# Patient Record
Sex: Female | Born: 1965 | Race: Black or African American | Hispanic: No | Marital: Single | Smoking: Never smoker
Health system: Southern US, Community
[De-identification: ages and names within clinical notes are randomized; demographics above are authoritative.]

## PROBLEM LIST (undated history)

## (undated) DIAGNOSIS — E785 Hyperlipidemia, unspecified: Secondary | ICD-10-CM

## (undated) DIAGNOSIS — R072 Precordial pain: Secondary | ICD-10-CM

## (undated) DIAGNOSIS — E119 Type 2 diabetes mellitus without complications: Secondary | ICD-10-CM

## (undated) DIAGNOSIS — D259 Leiomyoma of uterus, unspecified: Secondary | ICD-10-CM

## (undated) DIAGNOSIS — I10 Essential (primary) hypertension: Secondary | ICD-10-CM

## (undated) DIAGNOSIS — I2510 Atherosclerotic heart disease of native coronary artery without angina pectoris: Secondary | ICD-10-CM

## (undated) DIAGNOSIS — N924 Excessive bleeding in the premenopausal period: Secondary | ICD-10-CM

## (undated) DIAGNOSIS — M5412 Radiculopathy, cervical region: Secondary | ICD-10-CM

## (undated) DIAGNOSIS — G43109 Migraine with aura, not intractable, without status migrainosus: Secondary | ICD-10-CM

## (undated) HISTORY — DX: Atherosclerotic heart disease of native coronary artery without angina pectoris: I25.10

## (undated) HISTORY — DX: Type 2 diabetes mellitus without complications: E11.9

## (undated) HISTORY — DX: Hyperlipidemia, unspecified: E78.5

## (undated) HISTORY — DX: Radiculopathy, cervical region: M54.12

## (undated) HISTORY — DX: Excessive bleeding in the premenopausal period: N92.4

## (undated) HISTORY — DX: Migraine with aura, not intractable, without status migrainosus: G43.109

## (undated) HISTORY — DX: Leiomyoma of uterus, unspecified: D25.9

---

## 2016-10-29 ENCOUNTER — Ambulatory Visit (HOSPITAL_COMMUNITY)
Admission: EM | Admit: 2016-10-29 | Discharge: 2016-10-29 | Disposition: A | Discharge status: Home / Self Care | Payer: BLUE CROSS/BLUE SHIELD | Attending: Radiology | Admitting: Radiology

## 2016-10-29 ENCOUNTER — Encounter (HOSPITAL_COMMUNITY): Payer: Self-pay | Admitting: Family Medicine

## 2016-10-29 DIAGNOSIS — H1089 Other conjunctivitis: Secondary | ICD-10-CM

## 2016-10-29 HISTORY — DX: Essential (primary) hypertension: I10

## 2016-10-29 MED ORDER — TOBRAMYCIN 0.3 % OP SOLN
OPHTHALMIC | Status: AC
  Filled 2016-10-29: qty 5

## 2016-10-29 MED ORDER — TETRACAINE HCL 0.5 % OP SOLN
OPHTHALMIC | Status: AC
  Filled 2016-10-29: qty 4

## 2016-10-29 MED ORDER — TOBRAMYCIN 0.3 % OP SOLN
2.0000 [drp] | Freq: Two times a day (BID) | OPHTHALMIC | Status: DC
  Administered 2016-10-29: 2 [drp] via OPHTHALMIC

## 2016-10-29 NOTE — ED Provider Notes (Signed)
CSN: [id number]     Arrival date & time 10/29/16  1832 History   First MD Initiated Contact with Patient 10/29/16 1929     Chief Complaint  Patient presents with  . Eye Problem   (Consider location/radiation/quality/duration/timing/severity/associated sxs/prior Treatment) 51 y.o. female presents with right eye pain and discharge upon waking this morning. Patient denies any trauma and states she went to bed with no issues and woke up  And her eye was matted shut. Condition is acute in nature. Condition is made better by nothi9ng. Condition is made worse by nothing. Patient denies any treatment prior to there arrival at this facility. Patient states that her vision is impaired.        Past Medical History:  Diagnosis Date  . Hypertension    History reviewed. No pertinent surgical history. History reviewed. No pertinent family history. Social History  Substance Use Topics  . Smoking status: Not on file  . Smokeless tobacco: Not on file  . Alcohol use Not on file   OB History    No data available     Review of Systems  Constitutional: Negative for chills and fever.  HENT: Negative for ear pain and sore throat.   Eyes: Positive for discharge, redness and visual disturbance. Negative for pain.  Respiratory: Negative for cough and shortness of breath.   Cardiovascular: Negative for chest pain and palpitations.  Gastrointestinal: Negative for abdominal pain and vomiting.  Genitourinary: Negative for dysuria and hematuria.  Musculoskeletal: Negative for arthralgias and back pain.  Skin: Negative for color change and rash.  Neurological: Negative for seizures and syncope.  All other systems reviewed and are negative.   Allergies  Patient has no known allergies.  Home Medications   Prior to Admission medications   Not on File   Meds Ordered and Administered this Visit  Medications - No data to display  BP (!) 163/82   Pulse 82   Temp 97.9 F (36.6 C) (Oral)   Resp 18    SpO2 100%  No data found.   Physical Exam  Constitutional: She is oriented to person, place, and time. She appears well-developed and well-nourished.  HENT:  Head: Normocephalic and atraumatic.  Eyes: Pupils are equal, round, and reactive to light. Right eye exhibits discharge ( clear).  conjunctiva red to right eye. Clear discharge noted.   Neck: Normal range of motion.  Pulmonary/Chest: Effort normal.  Neurological: She is alert and oriented to person, place, and time.  Psychiatric: She has a normal mood and affect.  Nursing note and vitals reviewed.   Urgent Care Course     Procedures (including critical care time)  Labs Review Labs Reviewed - No data to display  Imaging Review No results found.   Visual Acuity Review  Noted in nursing notes      No abdnormality noted under fluoroscopic visualization.   MDM  No diagnosis found.    Jacqualine Mau, NP 10/29/16 2027

## 2016-10-29 NOTE — ED Notes (Signed)
Visual   Acuity  20/  50  Left    20/50  In  Right

## 2016-10-29 NOTE — ED Triage Notes (Signed)
Pt here for right eye, redness, drainage since today.

## 2016-10-29 NOTE — Discharge Instructions (Addendum)
Follow up with opthalmology on Monday. Apply drops to affected eye every 4 hours.

## 2017-01-12 DIAGNOSIS — M5412 Radiculopathy, cervical region: Secondary | ICD-10-CM

## 2017-01-12 DIAGNOSIS — G43109 Migraine with aura, not intractable, without status migrainosus: Secondary | ICD-10-CM

## 2017-01-12 HISTORY — DX: Migraine with aura, not intractable, without status migrainosus: G43.109

## 2017-01-12 HISTORY — DX: Radiculopathy, cervical region: M54.12

## 2017-01-28 ENCOUNTER — Emergency Department (HOSPITAL_COMMUNITY)
Admission: EM | Admit: 2017-01-28 | Discharge: 2017-01-28 | Disposition: A | Discharge status: Home / Self Care | Payer: BLUE CROSS/BLUE SHIELD | Attending: Emergency Medicine | Admitting: Emergency Medicine

## 2017-01-28 ENCOUNTER — Encounter (HOSPITAL_COMMUNITY): Payer: Self-pay | Admitting: Emergency Medicine

## 2017-01-28 ENCOUNTER — Emergency Department (HOSPITAL_COMMUNITY): Payer: BLUE CROSS/BLUE SHIELD

## 2017-01-28 DIAGNOSIS — I10 Essential (primary) hypertension: Secondary | ICD-10-CM | POA: Diagnosis not present

## 2017-01-28 DIAGNOSIS — R102 Pelvic and perineal pain: Secondary | ICD-10-CM

## 2017-01-28 DIAGNOSIS — D259 Leiomyoma of uterus, unspecified: Secondary | ICD-10-CM

## 2017-01-28 DIAGNOSIS — N760 Acute vaginitis: Secondary | ICD-10-CM | POA: Diagnosis not present

## 2017-01-28 DIAGNOSIS — B9689 Other specified bacterial agents as the cause of diseases classified elsewhere: Secondary | ICD-10-CM

## 2017-01-28 LAB — COMPREHENSIVE METABOLIC PANEL
ALK PHOS: 53 U/L (ref 38–126)
ALT: 14 U/L (ref 14–54)
ANION GAP: 9 (ref 5–15)
AST: 24 U/L (ref 15–41)
Albumin: 3.5 g/dL (ref 3.5–5.0)
BILIRUBIN TOTAL: 0.6 mg/dL (ref 0.3–1.2)
BUN: 7 mg/dL (ref 6–20)
CALCIUM: 9 mg/dL (ref 8.9–10.3)
CO2: 22 mmol/L (ref 22–32)
Chloride: 103 mmol/L (ref 101–111)
Creatinine, Ser: 0.95 mg/dL (ref 0.44–1.00)
GFR calc Af Amer: >60 mL/min (ref >60)
GFR calc non Af Amer: >60 mL/min (ref >60)
GLUCOSE: 99 mg/dL (ref 65–99)
Potassium: 3.9 mmol/L (ref 3.5–5.1)
Sodium: 134 mmol/L — ABNORMAL LOW (ref 135–145)
TOTAL PROTEIN: 8 g/dL (ref 6.5–8.1)

## 2017-01-28 LAB — URINALYSIS, ROUTINE W REFLEX MICROSCOPIC
Bilirubin Urine: NEGATIVE
GLUCOSE, UA: NEGATIVE mg/dL
Ketones, ur: NEGATIVE mg/dL
NITRITE: NEGATIVE
PH: 6 (ref 5.0–8.0)
Protein, ur: 30 mg/dL — AB
SPECIFIC GRAVITY, URINE: 1.008 (ref 1.005–1.030)

## 2017-01-28 LAB — CBC
HCT: 33.5 % — ABNORMAL LOW (ref 36.0–46.0)
Hemoglobin: 11.1 g/dL — ABNORMAL LOW (ref 12.0–15.0)
MCH: 25 pg — ABNORMAL LOW (ref 26.0–34.0)
MCHC: 33.1 g/dL (ref 30.0–36.0)
MCV: 75.5 fL — ABNORMAL LOW (ref 78.0–100.0)
PLATELETS: 341 10*3/uL (ref 150–400)
RBC: 4.44 MIL/uL (ref 3.87–5.11)
RDW: 14.4 % (ref 11.5–15.5)
WBC: 9.1 10*3/uL (ref 4.0–10.5)

## 2017-01-28 LAB — LIPASE, BLOOD: Lipase: 42 U/L (ref 11–51)

## 2017-01-28 LAB — WET PREP, GENITAL
SPERM: NONE SEEN
Trich, Wet Prep: NONE SEEN
Yeast Wet Prep HPF POC: NONE SEEN

## 2017-01-28 LAB — I-STAT BETA HCG BLOOD, ED (MC, WL, AP ONLY): I-stat hCG, quantitative: <5 m[IU]/mL (ref <5)

## 2017-01-28 MED ORDER — FENTANYL CITRATE (PF) 100 MCG/2ML IJ SOLN
INTRAMUSCULAR | Status: AC
  Filled 2017-01-28: qty 2

## 2017-01-28 MED ORDER — ONDANSETRON HCL 4 MG/2ML IJ SOLN
4.0000 mg | Freq: Once | INTRAMUSCULAR | Status: AC | PRN
  Administered 2017-01-28: 4 mg via INTRAVENOUS

## 2017-01-28 MED ORDER — FENTANYL CITRATE (PF) 100 MCG/2ML IJ SOLN
50.0000 ug | INTRAMUSCULAR | Status: DC | PRN
  Administered 2017-01-28: 50 ug via INTRAVENOUS

## 2017-01-28 MED ORDER — KETOROLAC TROMETHAMINE 30 MG/ML IJ SOLN
30.0000 mg | Freq: Once | INTRAMUSCULAR | Status: AC
  Administered 2017-01-28: 30 mg via INTRAVENOUS
  Filled 2017-01-28: qty 1

## 2017-01-28 MED ORDER — ONDANSETRON HCL 4 MG/2ML IJ SOLN
INTRAMUSCULAR | Status: AC
  Filled 2017-01-28: qty 2

## 2017-01-28 MED ORDER — METRONIDAZOLE 500 MG PO TABS: 500.0000 mg | ORAL_TABLET | Freq: Two times a day (BID) | ORAL | 0 refills | Status: DC

## 2017-01-28 NOTE — ED Provider Notes (Signed)
[facility] EMERGENCY DEPARTMENT Provider Note   CSN: [id number] Arrival date & time: 01/28/17  0219     History   Chief Complaint Chief Complaint  Patient presents with  . Pelvic Pain    HPI Jacqueline Hodges is a 51 y.o. female who presents with pelvic pain that began yesterday. Patient reports that pain is a constant "throbbing" and states that the pain is 8/10. She states that she has been taking ibuprofen with minimal improvement in symptoms. She denies any other alleviating factors. Patient denies any aggravating factors. Patient states that she initially thought it was a start of her menstrual cycle and she was experiencing cramps. She states that she has some blood when urinating but does not have any vaginal bleeding otherwise. Patient reports that her LMP was approximately with one month ago but does not recall the approximate date. Patient reports associated nausea and decreased appetite but no vomiting. Patient reports her last bowel movement was yesterday and denies any presence of blood. Patient states that she currently is active with one partner. They do not use any protection. Patient denies any history of STDs. Patient states that she recently moved from [facility] and has not been able to have a primary care doctor since moving up to [place]. She was previously on lisinopril but states that she has not taken it for several weeks because she has been out and has not had anybody to refill her medications. Patient denies any fevers, chills, chest pain, SOB, headaches, vision changes, dysuria, vaginal bleeding, vaginal discharge, numbness/weakness of her arms or legs.  The history is provided by the patient.    Past Medical History:  Diagnosis Date  . Hypertension     There are no active problems to display for this patient.   History reviewed. No pertinent surgical history.  OB History    No data available       Home Medications     Prior to Admission medications   Medication Sig Start Date End Date Taking? Authorizing Provider  tiZANidine (ZANAFLEX) 4 MG tablet Take 12 mg by mouth at bedtime. 01/12/17 01/12/18 Yes [provider]  metroNIDAZOLE (FLAGYL) 500 MG tablet Take 1 tablet (500 mg total) by mouth 2 (two) times daily. 01/28/17   Volanda Napoleon, PA-C    Family History No family history on file.  Social History Social History  Substance Use Topics  . Smoking status: Never Smoker  . Smokeless tobacco: Never Used  . Alcohol use No     Allergies   Patient has no known allergies.   Review of Systems Review of Systems  Constitutional: Negative for fever.  Eyes: Negative for visual disturbance.  Respiratory: Positive for cough. Negative for shortness of breath.   Cardiovascular: Negative for chest pain.  Gastrointestinal: Positive for abdominal pain and nausea. Negative for blood in stool, diarrhea and vomiting.  Genitourinary: Positive for hematuria and pelvic pain. Negative for dysuria.  Musculoskeletal: Negative for back pain and neck pain.  Neurological: Negative for weakness, numbness and headaches.     Physical Exam Updated Vital Signs BP (!) 179/95   Pulse 63   Temp 98.1 F (36.7 C)   Resp 18   Ht 5\' 5"  (1.651 m)   Wt 89.4 kg (197 lb)   LMP 01/28/2017 (Exact Date)   SpO2 97%   BMI 32.78 kg/m   Physical Exam  Constitutional: She is oriented to person, place, and time. She appears well-developed and well-nourished.  Sitting comfortably on examination table  HENT:  Head: Normocephalic and atraumatic.  Mouth/Throat: Oropharynx is clear and moist and mucous membranes are normal.  Eyes: Pupils are equal, round, and reactive to light. Conjunctivae, EOM and lids are normal.  Neck: Full passive range of motion without pain.  Cardiovascular: Normal rate, regular rhythm, normal heart sounds and normal pulses.  Exam reveals no gallop and no friction rub.   No murmur  heard. Pulmonary/Chest: Effort normal and breath sounds normal.  Abdominal: Soft. Normal appearance. There is tenderness in the suprapubic area. There is no rigidity, no guarding, no CVA tenderness and no tenderness at McBurney's point.  Genitourinary: Uterus normal. Cervix exhibits no motion tenderness. Right adnexum displays no mass and no tenderness. Left adnexum displays no mass and no tenderness. There is bleeding in the vagina.  Genitourinary Comments: The exam was performed with a chaperone present. Normal external female genitalia. No lesions, rash, or sores. Difficulty visualizing the cervix secondary to vaginal bleeding. No CMT. No adnexal mass or tenderness   Musculoskeletal: Normal range of motion.  Neurological: She is alert and oriented to person, place, and time. GCS eye subscore is 4. GCS verbal subscore is 5. GCS motor subscore is 6.  Cranial nerves III-XII intact Follows commands, Moves all extremities  5/5 strength to BUE and BLE  Sensation intact throughout all major nerve distributions Normal finger to nose. No dysdiadochokinesia. No pronator drift. No gait abnormalities  No slurred speech. No facial droop.   Skin: Skin is warm and dry. Capillary refill takes less than 2 seconds.  Psychiatric: She has a normal mood and affect. Her speech is normal.  Nursing note and vitals reviewed.    ED Treatments / Results  Labs (all labs ordered are listed, but only abnormal results are displayed) Labs Reviewed  WET PREP, GENITAL - Abnormal; Notable for the following:       Result Value   Clue Cells Wet Prep HPF POC PRESENT (*)    WBC, Wet Prep HPF POC MODERATE (*)    All other components within normal limits  COMPREHENSIVE METABOLIC PANEL - Abnormal; Notable for the following:    Sodium 134 (*)    All other components within normal limits  CBC - Abnormal; Notable for the following:    Hemoglobin 11.1 (*)    HCT 33.5 (*)    MCV 75.5 (*)    MCH 25.0 (*)    All other  components within normal limits  URINALYSIS, ROUTINE W REFLEX MICROSCOPIC - Abnormal; Notable for the following:    Color, Urine STRAW (*)    Hgb urine dipstick LARGE (*)    Protein, ur 30 (*)    Leukocytes, UA SMALL (*)    Bacteria, UA MANY (*)    Squamous Epithelial / LPF 0-5 (*)    All other components within normal limits  LIPASE, BLOOD  I-STAT BETA HCG BLOOD, ED ([facility], [facility], [facility] ONLY)  GC/CHLAMYDIA PROBE AMP ([place]) NOT AT [facility]    EKG  EKG Interpretation None       Radiology US Transvaginal Non-ob  Result Date: 01/28/2017 CLINICAL DATA:  51 year old premenopausal female with acute right pelvic pain for 1 day. EXAM: TRANSABDOMINAL AND TRANSVAGINAL ULTRASOUND OF PELVIS DOPPLER ULTRASOUND OF OVARIES TECHNIQUE: Both transabdominal and transvaginal ultrasound examinations of the pelvis were performed. Transabdominal technique was performed for global imaging of the pelvis including uterus, ovaries, adnexal regions, and pelvic cul-de-sac. It was necessary to proceed with endovaginal exam following the transabdominal  exam to visualize the ovaries and endometrium. Color and duplex Doppler ultrasound was utilized to evaluate blood flow to the ovaries. COMPARISON:  None. FINDINGS: Uterus Measurements: 10.4 x 6.2 x 7.6 cm and anteverted. Two intramural fibroids are identified as follows: A 2.4 x 1.8 x 2.3 cm posterior fundal fibroid. A 2.7 x 2.9 x 3.3 cm anterior right fundal fibroid. Endometrium Thickness: 11 mm.  No focal abnormality visualized. Right ovary Measurements: 2.6 x 1.7 x 1.8 cm. Normal appearance/no adnexal mass. Left ovary Measurements: 3 x 1.7 x 2.6 cm. A 1.5 x 1.4 x 1.3 cm probable cyst/corpus luteum noted. Pulsed Doppler evaluation of both ovaries demonstrates normal low-resistance arterial and venous waveforms. Other findings A trace amount of free pelvic fluid is noted. IMPRESSION: 1. No evidence of ovarian torsion. 2. Two intramural fundal fibroids as described. 3. Trace  amount of free pelvic fluid which may be physiologic. Electronically Signed   By: Margarette Canada M.D.   On: 01/28/2017 08:34   US Pelvis Complete  Result Date: 01/28/2017 CLINICAL DATA:  51 year old premenopausal female with acute right pelvic pain for 1 day. EXAM: TRANSABDOMINAL AND TRANSVAGINAL ULTRASOUND OF PELVIS DOPPLER ULTRASOUND OF OVARIES TECHNIQUE: Both transabdominal and transvaginal ultrasound examinations of the pelvis were performed. Transabdominal technique was performed for global imaging of the pelvis including uterus, ovaries, adnexal regions, and pelvic cul-de-sac. It was necessary to proceed with endovaginal exam following the transabdominal exam to visualize the ovaries and endometrium. Color and duplex Doppler ultrasound was utilized to evaluate blood flow to the ovaries. COMPARISON:  None. FINDINGS: Uterus Measurements: 10.4 x 6.2 x 7.6 cm and anteverted. Two intramural fibroids are identified as follows: A 2.4 x 1.8 x 2.3 cm posterior fundal fibroid. A 2.7 x 2.9 x 3.3 cm anterior right fundal fibroid. Endometrium Thickness: 11 mm.  No focal abnormality visualized. Right ovary Measurements: 2.6 x 1.7 x 1.8 cm. Normal appearance/no adnexal mass. Left ovary Measurements: 3 x 1.7 x 2.6 cm. A 1.5 x 1.4 x 1.3 cm probable cyst/corpus luteum noted. Pulsed Doppler evaluation of both ovaries demonstrates normal low-resistance arterial and venous waveforms. Other findings A trace amount of free pelvic fluid is noted. IMPRESSION: 1. No evidence of ovarian torsion. 2. Two intramural fundal fibroids as described. 3. Trace amount of free pelvic fluid which may be physiologic. Electronically Signed   By: Margarette Canada M.D.   On: 01/28/2017 08:34   [place] Art/ven Flow Abd Pelv Doppler  Result Date: 01/28/2017 CLINICAL DATA:  51 year old premenopausal female with acute right pelvic pain for 1 day. EXAM: TRANSABDOMINAL AND TRANSVAGINAL ULTRASOUND OF PELVIS DOPPLER ULTRASOUND OF OVARIES TECHNIQUE: Both  transabdominal and transvaginal ultrasound examinations of the pelvis were performed. Transabdominal technique was performed for global imaging of the pelvis including uterus, ovaries, adnexal regions, and pelvic cul-de-sac. It was necessary to proceed with endovaginal exam following the transabdominal exam to visualize the ovaries and endometrium. Color and duplex Doppler ultrasound was utilized to evaluate blood flow to the ovaries. COMPARISON:  None. FINDINGS: Uterus Measurements: 10.4 x 6.2 x 7.6 cm and anteverted. Two intramural fibroids are identified as follows: A 2.4 x 1.8 x 2.3 cm posterior fundal fibroid. A 2.7 x 2.9 x 3.3 cm anterior right fundal fibroid. Endometrium Thickness: 11 mm.  No focal abnormality visualized. Right ovary Measurements: 2.6 x 1.7 x 1.8 cm. Normal appearance/no adnexal mass. Left ovary Measurements: 3 x 1.7 x 2.6 cm. A 1.5 x 1.4 x 1.3 cm probable cyst/corpus luteum noted. Pulsed Doppler evaluation of  both ovaries demonstrates normal low-resistance arterial and venous waveforms. Other findings A trace amount of free pelvic fluid is noted. IMPRESSION: 1. No evidence of ovarian torsion. 2. Two intramural fundal fibroids as described. 3. Trace amount of free pelvic fluid which may be physiologic. Electronically Signed   By: Margarette Canada M.D.   On: 01/28/2017 08:34    Procedures Procedures (including critical care time)  Medications Ordered in ED Medications  ondansetron (ZOFRAN) injection 4 mg (4 mg Intravenous Given 01/28/17 0245)  ketorolac (TORADOL) 30 MG/ML injection 30 mg (30 mg Intravenous Given 01/28/17 0844)     Initial Impression / Assessment and Plan / ED Course  I have reviewed the triage vital signs and the nursing notes.  Pertinent labs & imaging results that were available during my care of the patient were reviewed by me and considered in my medical decision making (see chart for details).     51 year old female who presents with pelvic pain that began  yesterday. Patient also reports that she started having some blood in her urine this morning. Has not had any vaginal bleeding. Patient is afebrile, non-toxic appearing, sitting comfortably on examination table. Vital signs reviewed. Initial vital signs and the patient is hypertensive. She reports a history of hypertension and states that she was previously on lisinopril. Patient states that she has been out of lisinopril for several weeks and has not taken it. She has not had chance follow-up with primary care doctor lisinopril refilled. She does not recall what she is on. Consider UTI vs acute infectious etiology vs pregnancy vs msk pain. Initial labs ordered triage. Initial pain medications given at triage. Analgesics provided in the department.  While patient was in ED, patient states that she started having some vaginal bleeding and started her period. Suspect this may be contributing to her symptoms. Patient reports that she had 2 periods this last month. She reports that her last period was approximately 2-3 weeks ago.  Labs reviewed. Beta negative. CMP is unremarkable. Lipase unremarkable. CBC shows anemia of 11.1 and 33.5. No priors for comparison. UA shows large hemoglobin, small leuks, pyuria. Will plan for pelvic exam in the department.  Pelvic exam as documented above. There is vaginal bleeding noted in the canal which made the cervix is difficult to visualize. No CMT on bimanual exam. Pelvic exam is not concerning for PID. Wet prep and GC/chlamydia sent. Given extensive bleeding, will evaluate with ultrasound.  Ultrasound shows evidence of 2 intramural uterine fibroids. Suspect this may be the cause of patient's pain and dysfunctional uterine bleeding. There is no evidence of ovarian torsion. Wet prep reviewed. Positive for clue cells. Discussed results with patient. Reevaluation shows improved abdominal exam. She is still slightly tender to the lower pelvic region but otherwise unremarkable.  Plan to provide patient with outpatient opioid 20 and referral to follow-up regarding uterine fibroids. Instructed on conservative home therapy. Patient provided clinic resources for primary care follow-up. Instructed patient to follow-up regarding her high blood pressure and getting her prescription medications refilled. Strict return precautions discussed. Patient expresses understanding and agreement to plan.     Final Clinical Impressions(s) / ED Diagnoses   Final diagnoses:  Uterine leiomyoma, unspecified location  BV (bacterial vaginosis)  Pelvic pain    New Prescriptions Discharge Medication List as of 01/28/2017  9:14 AM    START taking these medications   Details  metroNIDAZOLE (FLAGYL) 500 MG tablet Take 1 tablet (500 mg total) by mouth 2 (two) times daily., Starting  Sat 01/28/2017, Print         Volanda Napoleon, PA-C 01/29/17 1524    Ripley Fraise, MD 01/31/17 1433

## 2017-01-28 NOTE — ED Notes (Signed)
Pt states she understandsinstructions. Home stable with fiance with steady gait.

## 2017-01-28 NOTE — ED Triage Notes (Signed)
Reports  Having pelvic pain that started yesterday that got worse throughout the day.  Reports it started with nausea.  Now c/o pelvic pain and rectal pain.  Notably uncomfortable in triage.

## 2017-01-28 NOTE — ED Notes (Signed)
Took patient saline lot out

## 2017-01-28 NOTE — Discharge Instructions (Signed)
You can take Tylenol or Ibuprofen as directed for pain. You can alternate Tylenol and Ibuprofen every 4 hours. If you take Tylenol at 1pm, then you can take Ibuprofen at 5pm. Then you can take Tylenol again at 9pm. Do not take any more ibuprofen until later tonight because the medication we gave you has ibuprofen in it.   Take Flagyl as directed.  It is very important that you do not consume any alcohol while taking this medication as it will cause you to become violently ill.  Follow-up with the referred [facility] for further evaluation.   Follow-up with the referred [facility] for evaluation of your blood pressure and for restarting your medications.   Return to the Emergency Department for any worsening pain, vomiting, chest pain, SOB, fever, or any other worsening or concerning symptoms.

## 2017-01-30 LAB — GC/CHLAMYDIA PROBE AMP (~~LOC~~) NOT AT ARMC
CHLAMYDIA, DNA PROBE: NEGATIVE
Neisseria Gonorrhea: NEGATIVE

## 2017-02-20 ENCOUNTER — Encounter: Payer: Self-pay | Admitting: Obstetrics & Gynecology

## 2017-02-20 ENCOUNTER — Ambulatory Visit (INDEPENDENT_AMBULATORY_CARE_PROVIDER_SITE_OTHER): Payer: BLUE CROSS/BLUE SHIELD | Admitting: Obstetrics & Gynecology

## 2017-02-20 ENCOUNTER — Ambulatory Visit (INDEPENDENT_AMBULATORY_CARE_PROVIDER_SITE_OTHER): Payer: Self-pay | Admitting: Clinical

## 2017-02-20 VITALS — BP 158/98 | HR 72 | Ht 65.0 in | Wt 198.0 lb

## 2017-02-20 DIAGNOSIS — N924 Excessive bleeding in the premenopausal period: Secondary | ICD-10-CM | POA: Diagnosis not present

## 2017-02-20 DIAGNOSIS — D259 Leiomyoma of uterus, unspecified: Secondary | ICD-10-CM | POA: Insufficient documentation

## 2017-02-20 DIAGNOSIS — F4321 Adjustment disorder with depressed mood: Secondary | ICD-10-CM

## 2017-02-20 DIAGNOSIS — R4589 Other symptoms and signs involving emotional state: Secondary | ICD-10-CM | POA: Diagnosis not present

## 2017-02-20 HISTORY — DX: Excessive bleeding in the premenopausal period: N92.4

## 2017-02-20 HISTORY — DX: Leiomyoma of uterus, unspecified: D25.9

## 2017-02-20 NOTE — Progress Notes (Signed)
Patient ID: Jacqueline Hodges, female   DOB: 1965-05-12, 51 y.o.   MRN: [id number]  Chief Complaint  Patient presents with  . Follow-up    from ER for pelvic pain  . Menstrual Problem    irregular bleeding, frequent periods    HPI Jacqueline Hodges is a 51 y.o. female.  She is referred from the ED after presenting with lower abdominal pain 10/23 thought to be associated with her menses. An US showed intramural fibroids. No bleeding or pain now. Patient's last menstrual period was 01/28/2017 (exact date).  HPI  Past Medical History:  Diagnosis Date  . Hypertension     Past Surgical History:  Procedure Laterality Date  . CESAREAN SECTION      History reviewed. No pertinent family history.  Social History Social History   Tobacco Use  . Smoking status: Never Smoker  . Smokeless tobacco: Never Used  Substance Use Topics  . Alcohol use: No  . Drug use: No    No Known Allergies  No current outpatient medications on file.   No current facility-administered medications for this visit.     Review of Systems Review of Systems  Constitutional: Negative.   Respiratory: Negative.   Gastrointestinal: Negative.   Genitourinary: Positive for menstrual problem (heavy menses) and pelvic pain.    Blood pressure (!) 158/98, pulse 72, height 5\' 5"  (1.651 m), weight 89.8 kg (198 lb), last menstrual period 01/28/2017.  Physical Exam Physical Exam  Constitutional: She appears well-developed. No distress.  Cardiovascular: Normal rate.  Pulmonary/Chest: Effort normal. No respiratory distress.  Genitourinary: Vagina normal. No vaginal discharge found.  Genitourinary Comments: Uterus 6 week size no adnexal masses    Data Reviewed CBC    Component Value Date/Time   WBC 9.1 01/28/2017 0244   RBC 4.44 01/28/2017 0244   HGB 11.1 (L) 01/28/2017 0244   HCT 33.5 (L) 01/28/2017 0244   PLT 341 01/28/2017 0244   MCV 75.5 (L) 01/28/2017 0244   MCH 25.0 (L) 01/28/2017 0244   MCHC 33.1  01/28/2017 0244   RDW 14.4 01/28/2017 0244   CLINICAL DATA:  51 year old premenopausal female with acute right pelvic pain for 1 day.  EXAM: TRANSABDOMINAL AND TRANSVAGINAL ULTRASOUND OF PELVIS  DOPPLER ULTRASOUND OF OVARIES  TECHNIQUE: Both transabdominal and transvaginal ultrasound examinations of the pelvis were performed. Transabdominal technique was performed for global imaging of the pelvis including uterus, ovaries, adnexal regions, and pelvic cul-de-sac.  It was necessary to proceed with endovaginal exam following the transabdominal exam to visualize the ovaries and endometrium. Color and duplex Doppler ultrasound was utilized to evaluate blood flow to the ovaries.  COMPARISON:  None.  FINDINGS: Uterus  Measurements: 10.4 x 6.2 x 7.6 cm and anteverted. Two intramural fibroids are identified as follows:  A 2.4 x 1.8 x 2.3 cm posterior fundal fibroid.  A 2.7 x 2.9 x 3.3 cm anterior right fundal fibroid.  Endometrium  Thickness: 11 mm.  No focal abnormality visualized.  Right ovary  Measurements: 2.6 x 1.7 x 1.8 cm. Normal appearance/no adnexal mass.  Left ovary  Measurements: 3 x 1.7 x 2.6 cm. A 1.5 x 1.4 x 1.3 cm probable cyst/corpus luteum noted.  Pulsed Doppler evaluation of both ovaries demonstrates normal low-resistance arterial and venous waveforms.  Other findings  A trace amount of free pelvic fluid is noted.  IMPRESSION: 1. No evidence of ovarian torsion. 2. Two intramural fundal fibroids as described. 3. Trace amount of free pelvic fluid which may be physiologic.  Electronically Signed   By: Margarette Canada M.D.   On: 01/28/2017 08:34  Assessment    Patient Active Problem List   Diagnosis Date Noted  . Fibroid uterus 02/20/2017  . Abnormal perimenopausal bleeding 02/20/2017  . Cervical radiculopathy 01/12/2017  . Migraine with aura and without status migrainosus, not intractable 01/12/2017        Plan     CBC, FSH, TSH sent Menstrual calendar RTC 3 months To see [place]          Emeterio Reeve 02/20/2017, 11:04 AM

## 2017-02-20 NOTE — BH Specialist Note (Signed)
Integrated Behavioral Health Initial Visit  MRN: [id number] Name: Jacqueline Hodges  Number of [facility] Clinician visits:: 1/6 Session Start time: 11:15  Session End time: 11:55 Total time: 40 minutes  Type of Service: [place] Interpretor:No. Interpretor Name and Language: n/a   Warm Hand Off Completed.       SUBJECTIVE: Jacqueline Hodges is a 51 y.o. female accompanied by n/a Patient was referred by Dr Roselie Awkward for symptoms of depression. Patient reports the following symptoms/concerns: Pt states her primary concern today is grieving three family losses in the past year, including her mother one year ago, and most recent, an aunt in past week. Pt is also adjusting to living away from her adult children.  Duration of problem: Most recent, past week; Severity of problem: moderate  OBJECTIVE: Mood: Anxious and Depressed and Affect: Depressed Risk of harm to self or others: No plan to harm self or others  LIFE CONTEXT: Family and Social: Supportive friend locally; adult children live near [place]. No siblings School/Work: Works two jobs; Production assistant, radio to one fulltime Self-Care: Recognizing need for greater self-care Life Changes: Loss of mother and two other family members in past year, move from [place] to [place] after loss of mother, including job change  GOALS ADDRESSED: Patient will: 1. Reduce symptoms of: anxiety and depression 2. Increase knowledge and/or ability of: self-management skills  3. Demonstrate ability to: Increase healthy adjustment to current life circumstances and Begin healthy grieving over loss  INTERVENTIONS: Interventions utilized: Mindfulness or Psychologist, educational, Supportive Counseling, Psychoeducation and/or Health Education and Link to [employer]  Standardized Assessments completed: GAD-7 and PHQ 9  ASSESSMENT: Patient currently experiencing Grief.   Patient may benefit  from psychoeducation and brief therapeutic intervention regarding coping with symptoms of anxiety and depression related to grief.  PLAN: 1. Follow up with behavioral health clinician on : As needed 2. Behavioral recommendations:  -Consider hospice group grief counseling, when ready -CALM relaxation breathing exercises as needed (prior to sleep; in morning) -Consider using sleep sound and other apps for additional self-coping -Read educational material regarding coping with symptoms of anxiety and depression 3. Referral(s): [facility] (In Clinic) 4. "From scale of 1-10, how likely are you to follow plan?": 8  Garlan Fair, LCSWA  Depression screen [facility] 2/9 02/20/2017  Decreased Interest 3  Down, Depressed, Hopeless 3  PHQ - 2 Score 6  Altered sleeping 3  Tired, decreased energy 3  Change in appetite 0  Feeling bad or failure about yourself  0  Trouble concentrating 0  Moving slowly or fidgety/restless 0  Suicidal thoughts 0  PHQ-9 Score 12   GAD 7 : Generalized Anxiety Score 02/20/2017  Nervous, Anxious, on Edge 1  Control/stop worrying 3  Worry too much - different things 3  Trouble relaxing 1  Restless 0  Easily annoyed or irritable 1  Afraid - awful might happen 0  Total GAD 7 Score 9

## 2017-02-20 NOTE — Patient Instructions (Signed)
Therapist, music at [facility], [place], [place] [address] [phone]  [facility] [place], [place], [place] [address] [phone]

## 2017-02-20 NOTE — Progress Notes (Signed)
Patient verbally consented to meet with [facility] Clinician about presenting concerns. Dr Roselie Awkward aware

## 2017-02-21 LAB — CBC
HEMOGLOBIN: 11.2 g/dL (ref 11.1–15.9)
Hematocrit: 36 % (ref 34.0–46.6)
MCH: 24.7 pg — AB (ref 26.6–33.0)
MCHC: 31.1 g/dL — AB (ref 31.5–35.7)
MCV: 80 fL (ref 79–97)
PLATELETS: 429 10*3/uL — AB (ref 150–379)
RBC: 4.53 x10E6/uL (ref 3.77–5.28)
RDW: 15.8 % — ABNORMAL HIGH (ref 12.3–15.4)
WBC: 5.9 10*3/uL (ref 3.4–10.8)

## 2017-02-21 LAB — FOLLICLE STIMULATING HORMONE: FSH: 30.1 m[IU]/mL

## 2017-02-21 LAB — TSH: TSH: 1.64 u[IU]/mL (ref 0.450–4.500)

## 2017-02-23 ENCOUNTER — Telehealth: Payer: Self-pay | Admitting: General Practice

## 2017-02-23 NOTE — Telephone Encounter (Signed)
Called patient, no answer- unable to leave message as voicemail box was full. Will send letter

## 2017-02-23 NOTE — Telephone Encounter (Signed)
-----   Message from Woodroe Mode, MD sent at 02/22/2017  8:25 AM EST ----- Perimenopausal, o/w normal

## 2017-07-14 ENCOUNTER — Other Ambulatory Visit: Payer: Self-pay

## 2017-07-14 ENCOUNTER — Emergency Department (HOSPITAL_COMMUNITY)
Admission: EM | Admit: 2017-07-14 | Discharge: 2017-07-14 | Disposition: A | Discharge status: Home / Self Care | Payer: BLUE CROSS/BLUE SHIELD | Attending: Emergency Medicine | Admitting: Emergency Medicine

## 2017-07-14 DIAGNOSIS — R102 Pelvic and perineal pain: Secondary | ICD-10-CM | POA: Diagnosis not present

## 2017-07-14 DIAGNOSIS — N939 Abnormal uterine and vaginal bleeding, unspecified: Secondary | ICD-10-CM | POA: Insufficient documentation

## 2017-07-14 LAB — COMPREHENSIVE METABOLIC PANEL
ALBUMIN: 3.3 g/dL — AB (ref 3.5–5.0)
ALT: 17 U/L (ref 14–54)
AST: 19 U/L (ref 15–41)
Alkaline Phosphatase: 52 U/L (ref 38–126)
Anion gap: 8 (ref 5–15)
BUN: 13 mg/dL (ref 6–20)
CHLORIDE: 107 mmol/L (ref 101–111)
CO2: 22 mmol/L (ref 22–32)
CREATININE: 0.9 mg/dL (ref 0.44–1.00)
Calcium: 9.2 mg/dL (ref 8.9–10.3)
GFR calc Af Amer: >60 mL/min (ref >60)
Glucose, Bld: 132 mg/dL — ABNORMAL HIGH (ref 65–99)
POTASSIUM: 3.6 mmol/L (ref 3.5–5.1)
Sodium: 137 mmol/L (ref 135–145)
Total Bilirubin: 0.4 mg/dL (ref 0.3–1.2)
Total Protein: 7.7 g/dL (ref 6.5–8.1)

## 2017-07-14 LAB — I-STAT BETA HCG BLOOD, ED (MC, WL, AP ONLY): I-stat hCG, quantitative: <5 m[IU]/mL (ref <5)

## 2017-07-14 LAB — WET PREP, GENITAL
Clue Cells Wet Prep HPF POC: NONE SEEN
SPERM: NONE SEEN
Trich, Wet Prep: NONE SEEN
WBC WET PREP: NONE SEEN
Yeast Wet Prep HPF POC: NONE SEEN

## 2017-07-14 LAB — CBC
HEMATOCRIT: 30.6 % — AB (ref 36.0–46.0)
Hemoglobin: 10.1 g/dL — ABNORMAL LOW (ref 12.0–15.0)
MCH: 24.6 pg — AB (ref 26.0–34.0)
MCHC: 33 g/dL (ref 30.0–36.0)
MCV: 74.6 fL — AB (ref 78.0–100.0)
PLATELETS: 344 10*3/uL (ref 150–400)
RBC: 4.1 MIL/uL (ref 3.87–5.11)
RDW: 15.7 % — AB (ref 11.5–15.5)
WBC: 7.3 10*3/uL (ref 4.0–10.5)

## 2017-07-14 LAB — RPR: RPR Ser Ql: NONREACTIVE

## 2017-07-14 LAB — LIPASE, BLOOD: LIPASE: 38 U/L (ref 11–51)

## 2017-07-14 LAB — HIV ANTIBODY (ROUTINE TESTING W REFLEX): HIV Screen 4th Generation wRfx: NONREACTIVE

## 2017-07-14 MED ORDER — MEDROXYPROGESTERONE ACETATE 10 MG PO TABS
10.0000 mg | ORAL_TABLET | Freq: Every day | ORAL | Status: DC
  Administered 2017-07-14: 10 mg via ORAL
  Filled 2017-07-14: qty 1

## 2017-07-14 MED ORDER — MEDROXYPROGESTERONE ACETATE 10 MG PO TABS: 10.0000 mg | ORAL_TABLET | Freq: Every day | ORAL | 0 refills | Status: DC

## 2017-07-14 MED ORDER — NAPROXEN 500 MG PO TABS: 500.0000 mg | ORAL_TABLET | Freq: Two times a day (BID) | ORAL | 0 refills | Status: DC

## 2017-07-14 MED ORDER — TRAMADOL HCL 50 MG PO TABS: 50.0000 mg | ORAL_TABLET | Freq: Four times a day (QID) | ORAL | 0 refills | Status: DC | PRN

## 2017-07-14 NOTE — ED Provider Notes (Signed)
[facility] DEPT Provider Note   CSN: [id number] Arrival date & time: 07/14/17  0013     History   Chief Complaint Chief Complaint  Patient presents with  . Vaginal Bleeding    HPI Jacqueline Hodges is a 52 y.o. female.  The history is provided by the patient.  She has history of hypertension, uterine fibroids, abnormal perimenopausal bleeding and comes in with heavy vaginal bleeding and cramping.  She states she did not have a menses last month, but started spotting 4 days ago.  Bleeding is gotten progressively worse each day.  She started passing clots yesterday and was passing large clots today.  Today, she started developing severe suprapubic cramping.  Pain is been as severe as 10/10.  She denies nausea or vomiting.  She denies dizziness, but does feel generally weak.  Of note, she is status post tubal ligation.  Past Medical History:  Diagnosis Date  . Hypertension     Patient Active Problem List   Diagnosis Date Noted  . Fibroid uterus 02/20/2017  . Abnormal perimenopausal bleeding 02/20/2017  . Cervical radiculopathy 01/12/2017  . Migraine with aura and without status migrainosus, not intractable 01/12/2017    Past Surgical History:  Procedure Laterality Date  . CESAREAN SECTION       OB History    Gravida  3   Para  3   Term  3   Preterm  0   AB  0   Living  3     SAB  0   TAB  0   Ectopic  0   Multiple  0   Live Births  0            Home Medications    Prior to Admission medications   Not on File    Family History No family history on file.  Social History Social History   Tobacco Use  . Smoking status: Never Smoker  . Smokeless tobacco: Never Used  Substance Use Topics  . Alcohol use: No  . Drug use: No     Allergies   Patient has no known allergies.   Review of Systems Review of Systems  All other systems reviewed and are negative.    Physical Exam Updated Vital Signs BP (!)  163/96 (BP Location: Left Arm)   Pulse 74   Temp 98.3 F (36.8 C) (Oral)   Resp 16   LMP 07/11/2017   SpO2 100%   Physical Exam  Nursing note and vitals reviewed.  52  year old female, resting comfortably and in no acute distress. Vital signs are significant for elevated blood pressure. Oxygen saturation is 100%, which is normal. Head is normocephalic and atraumatic. PERRLA, EOMI. Oropharynx is clear. Neck is nontender and supple without adenopathy or JVD. Back is nontender and there is no CVA tenderness. Lungs are clear without rales, wheezes, or rhonchi. Chest is nontender. Heart has regular rate and rhythm without murmur. Abdomen is soft, flat, nontender without masses or hepatosplenomegaly and peristalsis is normoactive. Elva: Normal external female genitalia.  Clot present in the cervical loss.  When cultures were obtained, clot was dislodged and there is a brief gush of blood.  There are no adnexal masses or tenderness.  There is no cervical motion tenderness.  Fundus is 6 weeks size. Extremities have trace edema, full range of motion is present. Skin is warm and dry without rash. Neurologic: Mental status is normal, cranial nerves are intact, there are no motor  or sensory deficits.  ED Treatments / Results  Labs (all labs ordered are listed, but only abnormal results are displayed) Labs Reviewed  COMPREHENSIVE METABOLIC PANEL - Abnormal; Notable for the following components:      Result Value   Glucose, Bld 132 (*)    Albumin 3.3 (*)    All other components within normal limits  CBC - Abnormal; Notable for the following components:   Hemoglobin 10.1 (*)    HCT 30.6 (*)    MCV 74.6 (*)    MCH 24.6 (*)    RDW 15.7 (*)    All other components within normal limits  WET PREP, GENITAL  LIPASE, BLOOD  RPR  HIV ANTIBODY (ROUTINE TESTING)  I-STAT BETA HCG BLOOD, ED ([facility], [facility], [facility] ONLY)  GC/CHLAMYDIA PROBE AMP ([facility]) NOT AT [facility]   Procedures Procedures   Medications  Ordered in ED Medications  medroxyPROGESTERone (PROVERA) tablet 10 mg (has no administration in time range)     Initial Impression / Assessment and Plan / ED Course  I have reviewed the triage vital signs and the nursing notes.  Pertinent lab results that were available during my care of the patient were reviewed by me and considered in my medical decision making (see chart for details).  Abnormal uterine bleeding and perimenopausal timeframe.  Old records are reviewed confirming, ED visit 6 months ago for pelvic pain and bleeding at which time ultrasound that showed 2 uterine fibroids.  Hemoglobin has fallen slightly from that, but she is hemodynamically stable.  Bleeding does not seem to be at a dangerous level.  She is discharged home with prescription for medroxyprogesterone and tramadol and is to follow-up with her gynecologist.  Final Clinical Impressions(s) / ED Diagnoses   Final diagnoses:  Abnormal vaginal bleeding  Pelvic cramping    ED Discharge Orders        Ordered    medroxyPROGESTERone (PROVERA) 10 MG tablet  Daily     07/14/17 0542    traMADol (ULTRAM) 50 MG tablet  Every 6 hours PRN     07/14/17 0542    naproxen (NAPROSYN) 500 MG tablet  2 times daily     78/29/56 2130       Delora Fuel, MD 86/57/84 [phone]

## 2017-07-14 NOTE — ED Triage Notes (Signed)
Pt from home with c/o vaginal bleeding. Pt states she has been told she has fibroids. Pt states her cycle started on Tuesday this month. Pt states she has been told her heavy bleeding is causing her to be anemic. Pt states she is lightheadedness today. Pt states she has had multiple clots pass. Pt has pelvic pain that she rates 3/10

## 2017-07-14 NOTE — Discharge Instructions (Signed)
Return if you are having any problems. 

## 2017-07-14 NOTE — ED Notes (Signed)
Pelvic cart at bedside. 

## 2017-07-17 LAB — GC/CHLAMYDIA PROBE AMP (~~LOC~~) NOT AT ARMC
Chlamydia: NEGATIVE
NEISSERIA GONORRHEA: NEGATIVE

## 2017-11-30 ENCOUNTER — Other Ambulatory Visit: Payer: Self-pay

## 2017-11-30 ENCOUNTER — Emergency Department (HOSPITAL_COMMUNITY): Payer: BLUE CROSS/BLUE SHIELD

## 2017-11-30 ENCOUNTER — Encounter (HOSPITAL_COMMUNITY): Payer: Self-pay

## 2017-11-30 ENCOUNTER — Emergency Department (HOSPITAL_COMMUNITY)
Admission: EM | Admit: 2017-11-30 | Discharge: 2017-11-30 | Disposition: A | Discharge status: Home / Self Care | Payer: BLUE CROSS/BLUE SHIELD | Attending: Emergency Medicine | Admitting: Emergency Medicine

## 2017-11-30 DIAGNOSIS — I10 Essential (primary) hypertension: Secondary | ICD-10-CM | POA: Diagnosis not present

## 2017-11-30 DIAGNOSIS — Z7982 Long term (current) use of aspirin: Secondary | ICD-10-CM | POA: Diagnosis not present

## 2017-11-30 DIAGNOSIS — Z79899 Other long term (current) drug therapy: Secondary | ICD-10-CM | POA: Diagnosis not present

## 2017-11-30 DIAGNOSIS — R0789 Other chest pain: Secondary | ICD-10-CM | POA: Diagnosis not present

## 2017-11-30 DIAGNOSIS — R079 Chest pain, unspecified: Secondary | ICD-10-CM | POA: Diagnosis present

## 2017-11-30 LAB — BASIC METABOLIC PANEL
Anion gap: 8 (ref 5–15)
BUN: 14 mg/dL (ref 6–20)
CO2: 25 mmol/L (ref 22–32)
Calcium: 9.9 mg/dL (ref 8.9–10.3)
Chloride: 109 mmol/L (ref 98–111)
Creatinine, Ser: 0.98 mg/dL (ref 0.44–1.00)
Glucose, Bld: 113 mg/dL — ABNORMAL HIGH (ref 70–99)
POTASSIUM: 4.3 mmol/L (ref 3.5–5.1)
SODIUM: 142 mmol/L (ref 135–145)

## 2017-11-30 LAB — CBC
HCT: 33.4 % — ABNORMAL LOW (ref 36.0–46.0)
Hemoglobin: 10.8 g/dL — ABNORMAL LOW (ref 12.0–15.0)
MCH: 22.7 pg — ABNORMAL LOW (ref 26.0–34.0)
MCHC: 32.3 g/dL (ref 30.0–36.0)
MCV: 70.2 fL — ABNORMAL LOW (ref 78.0–100.0)
PLATELETS: 368 10*3/uL (ref 150–400)
RBC: 4.76 MIL/uL (ref 3.87–5.11)
RDW: 17.6 % — AB (ref 11.5–15.5)
WBC: 5.4 10*3/uL (ref 4.0–10.5)

## 2017-11-30 LAB — I-STAT TROPONIN, ED
TROPONIN I, POC: 0 ng/mL (ref 0.00–0.08)
Troponin i, poc: 0 ng/mL (ref 0.00–0.08)

## 2017-11-30 MED ORDER — LISINOPRIL 10 MG PO TABS
10.0000 mg | ORAL_TABLET | Freq: Once | ORAL | Status: AC
  Administered 2017-11-30: 10 mg via ORAL
  Filled 2017-11-30: qty 1

## 2017-11-30 MED ORDER — LISINOPRIL-HYDROCHLOROTHIAZIDE 20-12.5 MG PO TABS: 1.0000 | ORAL_TABLET | Freq: Every day | ORAL | 0 refills | Status: DC

## 2017-11-30 NOTE — ED Provider Notes (Signed)
[facility] DEPT Provider Note   CSN: [id number] Arrival date & time: 11/30/17  1539     History   Chief Complaint Chief Complaint  Patient presents with  . Chest Pain    HPI Jacqueline Hodges is a 52 y.o. female.  Patient complains of some chest discomfort.  No shortness of breath no sweating.  Patient has not been taking her blood pressure medicine.  The history is provided by the patient. No language interpreter was used.  Chest Pain   This is a new problem. The current episode started less than 1 hour ago. The problem occurs rarely. The problem has been resolved. The pain is associated with movement. The pain is present in the substernal region. The pain is at a severity of 3/10. The pain is moderate. The quality of the pain is described as brief. The pain does not radiate. Exacerbated by: Unknown. Pertinent negatives include no abdominal pain, no back pain, no cough and no headaches.  Pertinent negatives for past medical history include no seizures.    Past Medical History:  Diagnosis Date  . Hypertension     Patient Active Problem List   Diagnosis Date Noted  . Fibroid uterus 02/20/2017  . Abnormal perimenopausal bleeding 02/20/2017  . Cervical radiculopathy 01/12/2017  . Migraine with aura and without status migrainosus, not intractable 01/12/2017    Past Surgical History:  Procedure Laterality Date  . CESAREAN SECTION       OB History    Gravida  3   Para  3   Term  3   Preterm  0   AB  0   Living  3     SAB  0   TAB  0   Ectopic  0   Multiple  0   Live Births  0            Home Medications    Prior to Admission medications   Medication Sig Start Date End Date Taking? Authorizing Provider  aspirin EC 325 MG tablet Take 650 mg by mouth daily as needed (chest pain).   Yes [provider]  lisinopril-hydrochlorothiazide (ZESTORETIC) 20-12.5 MG tablet Take 1 tablet by mouth daily. 11/30/17   Milton Ferguson, MD  medroxyPROGESTERone (PROVERA) 10 MG tablet Take 1 tablet (10 mg total) by mouth daily. Patient not taking: Reported on 8/54/6270 06/14/98   Delora Fuel, MD  naproxen (NAPROSYN) 500 MG tablet Take 1 tablet (500 mg total) by mouth 2 (two) times daily. Patient not taking: Reported on 9/38/1829 12/12/69   Delora Fuel, MD  traMADol (ULTRAM) 50 MG tablet Take 1 tablet (50 mg total) by mouth every 6 (six) hours as needed. Patient not taking: Reported on 6/96/7893 11/10/99   Delora Fuel, MD    Family History Family History  Problem Relation Age of Onset  . Heart failure Mother   . Diabetes Mother   . High Cholesterol Mother     Social History Social History   Tobacco Use  . Smoking status: Never Smoker  . Smokeless tobacco: Never Used  Substance Use Topics  . Alcohol use: No  . Drug use: No     Allergies   Patient has no known allergies.   Review of Systems Review of Systems  Constitutional: Negative for appetite change and fatigue.  HENT: Negative for congestion, ear discharge and sinus pressure.   Eyes: Negative for discharge.  Respiratory: Negative for cough.   Cardiovascular: Positive for chest pain.  Gastrointestinal:  Negative for abdominal pain and diarrhea.  Genitourinary: Negative for frequency and hematuria.  Musculoskeletal: Negative for back pain.  Skin: Negative for rash.  Neurological: Negative for seizures and headaches.  Psychiatric/Behavioral: Negative for hallucinations.     Physical Exam Updated Vital Signs BP (!) 174/96 Comment: Simultaneous filing. User may not have seen previous data.  Pulse 63   Temp 98.2 F (36.8 C) (Oral)   Resp 12   Ht 5\' 5"  (1.651 m)   Wt 99.8 kg   LMP 06/30/2017   SpO2 100%   BMI 36.61 kg/m   Physical Exam  Constitutional: She is oriented to person, place, and time. She appears well-developed.  HENT:  Head: Normocephalic.  Eyes: Conjunctivae and EOM are normal. No scleral icterus.  Neck: Neck supple. No  thyromegaly present.  Cardiovascular: Normal rate and regular rhythm. Exam reveals no gallop and no friction rub.  No murmur heard. Pulmonary/Chest: No stridor. She has no wheezes. She has no rales. She exhibits no tenderness.  Abdominal: She exhibits no distension. There is no tenderness. There is no rebound.  Musculoskeletal: Normal range of motion. She exhibits no edema.  Lymphadenopathy:    She has no cervical adenopathy.  Neurological: She is oriented to person, place, and time. She exhibits normal muscle tone. Coordination normal.  Skin: No rash noted. No erythema.  Psychiatric: She has a normal mood and affect. Her behavior is normal.     ED Treatments / Results  Labs (all labs ordered are listed, but only abnormal results are displayed) Labs Reviewed  BASIC METABOLIC PANEL - Abnormal; Notable for the following components:      Result Value   Glucose, Bld 113 (*)    All other components within normal limits  CBC - Abnormal; Notable for the following components:   Hemoglobin 10.8 (*)    HCT 33.4 (*)    MCV 70.2 (*)    MCH 22.7 (*)    RDW 17.6 (*)    All other components within normal limits  I-STAT TROPONIN, ED  I-STAT TROPONIN, ED    EKG EKG Interpretation  Date/Time:  Thursday November 30 2017 18:27:51 EDT Ventricular Rate:  62 PR Interval:    QRS Duration: 84 QT Interval:  399 QTC Calculation: 406 R Axis:   -8 Text Interpretation:  Sinus rhythm Borderline T abnormalities, inferior leads Confirmed by Milton Ferguson [phone]) on 11/30/2017 7:46:35 PM   Radiology Dg Chest 2 View  Result Date: 11/30/2017 CLINICAL DATA:  Intermittent left chest pain and shortness of breath since yesterday. EXAM: CHEST - 2 VIEW COMPARISON:  None. FINDINGS: Lungs are adequately inflated and otherwise clear. Cardiomediastinal silhouette and remainder of the exam is within normal. IMPRESSION: No active cardiopulmonary disease. Electronically Signed   By: Marin Olp M.D.   On: 11/30/2017  18:11    Procedures Procedures (including critical care time)  Medications Ordered in ED Medications  lisinopril (PRINIVIL,ZESTRIL) tablet 10 mg (10 mg Oral Given 11/30/17 1900)     Initial Impression / Assessment and Plan / ED Course  I have reviewed the triage vital signs and the nursing notes.  Pertinent labs & imaging results that were available during my care of the patient were reviewed by me and considered in my medical decision making (see chart for details).     Patient with mild anemia.  Troponin x2 is negative.  EKG shows nonspecific changes.  Patient is given a prescription for her blood pressure medicine which is lisinopril/HCTZ.  She has not  been taking it.  And she is referred to cardiology and is to follow-up with primary care  Final Clinical Impressions(s) / ED Diagnoses   Final diagnoses:  Atypical chest pain    ED Discharge Orders         Ordered    lisinopril-hydrochlorothiazide (ZESTORETIC) 20-12.5 MG tablet  Daily     11/30/17 1952           Milton Ferguson, MD 11/30/17 1956

## 2017-11-30 NOTE — Discharge Instructions (Addendum)
Follow-up with your family doctor.  And also follow-up with [facility] cardiology

## 2017-11-30 NOTE — ED Triage Notes (Addendum)
Patient c/o intermittent left chest pain, nausea and SOB since yesterday.  Patient was hypertensive in triage (166/112). patient states she ran out of her BP meds 1 1/2 years ago. (Lisinopril)

## 2017-11-30 NOTE — ED Notes (Signed)
Bed: WE99 Expected date:  Expected time:  Means of arrival:  Comments: EVS

## 2018-01-22 ENCOUNTER — Ambulatory Visit: Payer: BLUE CROSS/BLUE SHIELD | Admitting: Cardiology

## 2018-01-23 ENCOUNTER — Encounter: Payer: Self-pay | Admitting: Cardiology

## 2018-10-05 DIAGNOSIS — I160 Hypertensive urgency: Secondary | ICD-10-CM | POA: Insufficient documentation

## 2018-10-05 DIAGNOSIS — R079 Chest pain, unspecified: Secondary | ICD-10-CM | POA: Insufficient documentation

## 2018-10-06 DIAGNOSIS — E041 Nontoxic single thyroid nodule: Secondary | ICD-10-CM | POA: Insufficient documentation

## 2018-10-06 DIAGNOSIS — R918 Other nonspecific abnormal finding of lung field: Secondary | ICD-10-CM | POA: Insufficient documentation

## 2020-05-12 ENCOUNTER — Emergency Department (HOSPITAL_COMMUNITY): Payer: Self-pay

## 2020-05-12 ENCOUNTER — Encounter (HOSPITAL_COMMUNITY): Payer: Self-pay

## 2020-05-12 ENCOUNTER — Emergency Department (HOSPITAL_COMMUNITY)
Admission: EM | Admit: 2020-05-12 | Discharge: 2020-05-12 | Disposition: A | Discharge status: Home / Self Care | Payer: Self-pay | Attending: Emergency Medicine | Admitting: Emergency Medicine

## 2020-05-12 DIAGNOSIS — I10 Essential (primary) hypertension: Secondary | ICD-10-CM | POA: Insufficient documentation

## 2020-05-12 DIAGNOSIS — N938 Other specified abnormal uterine and vaginal bleeding: Secondary | ICD-10-CM | POA: Insufficient documentation

## 2020-05-12 DIAGNOSIS — Z79899 Other long term (current) drug therapy: Secondary | ICD-10-CM | POA: Insufficient documentation

## 2020-05-12 DIAGNOSIS — Z7982 Long term (current) use of aspirin: Secondary | ICD-10-CM | POA: Insufficient documentation

## 2020-05-12 DIAGNOSIS — N939 Abnormal uterine and vaginal bleeding, unspecified: Secondary | ICD-10-CM

## 2020-05-12 LAB — CBC WITH DIFFERENTIAL/PLATELET
Abs Immature Granulocytes: 0.02 10*3/uL (ref 0.00–0.07)
Basophils Absolute: 0 10*3/uL (ref 0.0–0.1)
Basophils Relative: 1 %
Eosinophils Absolute: 0.2 10*3/uL (ref 0.0–0.5)
Eosinophils Relative: 2 %
HCT: 36.5 % (ref 36.0–46.0)
Hemoglobin: 12 g/dL (ref 12.0–15.0)
Immature Granulocytes: 0 %
Lymphocytes Relative: 38 %
Lymphs Abs: 2.7 10*3/uL (ref 0.7–4.0)
MCH: 26.7 pg (ref 26.0–34.0)
MCHC: 32.9 g/dL (ref 30.0–36.0)
MCV: 81.3 fL (ref 80.0–100.0)
Monocytes Absolute: 0.6 10*3/uL (ref 0.1–1.0)
Monocytes Relative: 9 %
Neutro Abs: 3.4 10*3/uL (ref 1.7–7.7)
Neutrophils Relative %: 50 %
Platelets: 312 10*3/uL (ref 150–400)
RBC: 4.49 MIL/uL (ref 3.87–5.11)
RDW: 13.6 % (ref 11.5–15.5)
WBC: 6.9 10*3/uL (ref 4.0–10.5)
nRBC: 0 % (ref 0.0–0.2)

## 2020-05-12 LAB — PROTIME-INR
INR: 1.1 (ref 0.8–1.2)
Prothrombin Time: 13.5 seconds (ref 11.4–15.2)

## 2020-05-12 MED ORDER — LISINOPRIL-HYDROCHLOROTHIAZIDE 20-12.5 MG PO TABS: 1.0000 | ORAL_TABLET | Freq: Every day | ORAL | 0 refills | Status: DC

## 2020-05-12 NOTE — Discharge Instructions (Signed)
You were seen in the ER for vaginal bleeding.  As discussed, it is prudent that you follow-up with gynecologist. WE HAVE CALLED THE CLINIC -they told us that they will call you for an appointment.  If you do not hear back by Friday, please call the number provided.

## 2020-05-12 NOTE — ED Provider Notes (Signed)
[facility] DEPT Provider Note   CSN: [id number] Arrival date & time: 05/12/20  0925     History Chief Complaint  Patient presents with  . Vaginal Bleeding    Jacqueline Hodges is a 55 y.o. female.  HPI     55 year old female comes in a chief complaint of vaginal bleeding. Patient has history of hypertension, fibroids.  She comes to the ER with 2-3 months of vaginal bleeding.  Bleeding can be heavy on certain days.  She is not on any blood thinners.  She has no abdominal pain.  She has not seen an OB/GYN in over 2 years.  There is family history of ovarian cancer.  Additionally patient complains of elevated blood pressure and need for medication refill.  She has not seen a primary care doctor in over a year as well.  She is unsure what medication she was taking for blood pressure.  Review of system is negative for any dizziness, lightheadedness, chest pain, shortness of breath.  Patient has intermittent headaches.  Past Medical History:  Diagnosis Date  . Abnormal perimenopausal bleeding 02/20/2017  . Cervical radiculopathy 01/12/2017  . Fibroid uterus 02/20/2017  . Hypertension   . Migraine with aura and without status migrainosus, not intractable 01/12/2017    Patient Active Problem List   Diagnosis Date Noted  . Fibroid uterus 02/20/2017  . Abnormal perimenopausal bleeding 02/20/2017  . Cervical radiculopathy 01/12/2017  . Migraine with aura and without status migrainosus, not intractable 01/12/2017    Past Surgical History:  Procedure Laterality Date  . CESAREAN SECTION       OB History    Gravida  3   Para  3   Term  3   Preterm  0   AB  0   Living  3     SAB  0   IAB  0   Ectopic  0   Multiple  0   Live Births  0           Family History  Problem Relation Age of Onset  . Heart failure Mother   . Diabetes Mother   . High Cholesterol Mother     Social History   Tobacco Use  . Smoking status: Never  Smoker  . Smokeless tobacco: Never Used  Vaping Use  . Vaping Use: Never used  Substance Use Topics  . Alcohol use: No  . Drug use: No    Home Medications Prior to Admission medications   Medication Sig Start Date End Date Taking? Authorizing Provider  aspirin EC 325 MG tablet Take 650 mg by mouth daily as needed (chest pain).    [provider]  lisinopril-hydrochlorothiazide (ZESTORETIC) 20-12.5 MG tablet Take 1 tablet by mouth daily. 11/30/17   Milton Ferguson, MD  medroxyPROGESTERone (PROVERA) 10 MG tablet Take 1 tablet (10 mg total) by mouth daily. Patient not taking: Reported on 0/35/4656 11/09/25   Delora Fuel, MD  naproxen (NAPROSYN) 500 MG tablet Take 1 tablet (500 mg total) by mouth 2 (two) times daily. Patient not taking: Reported on 08/26/15 07/19/42   Delora Fuel, MD  traMADol (ULTRAM) 50 MG tablet Take 1 tablet (50 mg total) by mouth every 6 (six) hours as needed. Patient not taking: Reported on 9/67/5916 06/16/44   Delora Fuel, MD    Allergies    Patient has no known allergies.  Review of Systems   Review of Systems  Constitutional: Positive for activity change.  Respiratory: Negative for shortness of  breath.   Cardiovascular: Negative for chest pain.  Genitourinary: Positive for vaginal bleeding.  Neurological: Negative for dizziness and light-headedness.  Hematological: Does not bruise/bleed easily.  All other systems reviewed and are negative.   Physical Exam Updated Vital Signs BP (!) 187/92   Pulse (!) 59   Temp 98.7 F (37.1 C) (Oral)   Resp 16   Ht 5\' 5"  (1.651 m)   Wt 88.5 kg   LMP 07/11/2017   SpO2 99%   BMI 32.45 kg/m   Physical Exam Vitals and nursing note reviewed.  Constitutional:      Appearance: She is well-developed and well-nourished.  HENT:     Head: Normocephalic and atraumatic.  Eyes:     Extraocular Movements: EOM normal.     Pupils: Pupils are equal, round, and reactive to light.  Cardiovascular:     Rate and  Rhythm: Normal rate and regular rhythm.     Heart sounds: Normal heart sounds.  Pulmonary:     Effort: Pulmonary effort is normal. No respiratory distress.  Abdominal:     Palpations: Abdomen is soft.  Musculoskeletal:     Cervical back: Neck supple.  Skin:    General: Skin is warm and dry.  Neurological:     Mental Status: She is alert and oriented to person, place, and time.     ED Results / Procedures / Treatments   Labs (all labs ordered are listed, but only abnormal results are displayed) Labs Reviewed  COMPREHENSIVE METABOLIC PANEL  CBC WITH DIFFERENTIAL/PLATELET  PROTIME-INR    EKG None  Radiology US PELVIC COMPLETE WITH TRANSVAGINAL  Result Date: 05/12/2020 CLINICAL DATA:  Vaginal bleeding.  Postmenopausal. EXAM: TRANSABDOMINAL AND TRANSVAGINAL ULTRASOUND OF PELVIS TECHNIQUE: Both transabdominal and transvaginal ultrasound examinations of the pelvis were performed. Transabdominal technique was performed for global imaging of the pelvis including uterus, ovaries, adnexal regions, and pelvic cul-de-sac. It was necessary to proceed with endovaginal exam following the transabdominal exam to visualize the ovaries. COMPARISON:  Pelvic ultrasound dated January 28, 2017. FINDINGS: Uterus Measurements: 11.1 x 6.2 x 7.4 cm = volume: 268 mL. 2.5 x 2.2 x 1.8 cm intramural fibroid in the left anterior fundus, decreased in size since the prior study. 3.0 x 3.0 x 2.9 cm intramural fibroid in the right posterior fundus, increased in size since the prior study. Endometrium Thickness: 19 mm. 1.1 x 1.0 x 1.9 cm round anechoic area associated with the endometrium at the uterine fundus Right ovary Measurements: 4.0 x 2.6 x 2.1 cm = volume: 11 mL. 3.0 x 1.7 x 2.1 cm simple cyst. Left ovary Not visualized. Other findings Trace free fluid in the pelvis. IMPRESSION: 1. Thickened endometrium measuring 19 mm. In the setting of post-menopausal bleeding, endometrial sampling is indicated to exclude  carcinoma. If results are benign, sonohysterogram should be considered for focal lesion work-up. (Ref: Radiological Reasoning: Algorithmic Workup of Abnormal Vaginal Bleeding with Endovaginal Sonography and Sonohysterography. AJR 2008; 355:D32-20) 2. 1.9 cm round anechoic area associated with the endometrium at the uterine fundus could reflect a subendometrial cyst. 3. Two intramural fibroids again noted, one of which has mildly increased in size since 2018. 4. 3.0 cm benign simple cyst in the right ovary. No followup imaging recommended. Note: This recommendation does not apply to premenarchal patients or to those with increased risk (genetic, family history, elevated tumor markers or other high-risk factors) of ovarian cancer. Reference: Radiology 2019 Nov; 293(2):359-371. Electronically Signed   By: Titus Dubin M.D.   On:  05/12/2020 12:43    Procedures Procedures   Medications Ordered in ED Medications - No data to display  ED Course  I have reviewed the triage vital signs and the nursing notes.  Pertinent labs & imaging results that were available during my care of the patient were reviewed by me and considered in my medical decision making (see chart for details).    MDM Rules/Calculators/A&P                          55 year old female comes in with chief complaint of vaginal bleeding.  Ultrasound ordered for postmenopausal bleeding.  There is family history of ovarian CA, therefore concerns for cancer are high. Labs ordered as well.  2:12 PM Labs are pending, however ultrasound results are back.  She has thickened endometrium and a 3 cm ovarian cyst.  Likely still needs close follow-up with OB/GYN.  Patient does not have insurance.  I will speak with OB/GYN to see if patient can be taken care of.  Final Clinical Impression(s) / ED Diagnoses Final diagnoses:  DUB (dysfunctional uterine bleeding)  Primary hypertension    Rx / DC Orders ED Discharge Orders    None        Varney Biles, MD 05/12/20 1412

## 2020-05-12 NOTE — ED Triage Notes (Signed)
Pt arrived via walk in, c/o vaginal bleeding on and off for a month. States this has been an ongoing issue for the last two years. Has seen OBGYN with no answer at this time. Denies any urinary issue.   Also states HTN, supposed to be on medication, but not currently on any at this time.

## 2020-06-11 ENCOUNTER — Other Ambulatory Visit (HOSPITAL_COMMUNITY)
Admission: RE | Admit: 2020-06-11 | Discharge: 2020-06-11 | Disposition: A | Discharge status: Home / Self Care | Payer: Medicaid Other | Source: Ambulatory Visit | Attending: Obstetrics & Gynecology | Admitting: Obstetrics & Gynecology

## 2020-06-11 ENCOUNTER — Other Ambulatory Visit: Payer: Self-pay

## 2020-06-11 ENCOUNTER — Ambulatory Visit (INDEPENDENT_AMBULATORY_CARE_PROVIDER_SITE_OTHER): Payer: Self-pay | Admitting: Obstetrics & Gynecology

## 2020-06-11 ENCOUNTER — Encounter: Payer: Self-pay | Admitting: Obstetrics & Gynecology

## 2020-06-11 VITALS — BP 158/100 | HR 76 | Wt 200.7 lb

## 2020-06-11 DIAGNOSIS — N924 Excessive bleeding in the premenopausal period: Secondary | ICD-10-CM | POA: Insufficient documentation

## 2020-06-11 NOTE — Progress Notes (Signed)
Patient given informed consent, signed copy in the chart, time out was performed. Appropriate time out taken. . The patient was placed in the lithotomy position and the cervix brought into view with sterile speculum.  Portio of cervix cleansed x 2 with betadine swabs.  A tenaculum was placed in the anterior lip of the cervix.  The uterus was sounded for depth of 10 cm. A pipelle was introduced to into the uterus, suction created,  and an endometrial sample was obtained. All equipment was removed and accounted for.  The patient tolerated the procedure well.    Patient given post procedure instructions.  Woodroe Mode, MD 06/11/2020

## 2020-06-11 NOTE — Patient Instructions (Addendum)
Endometrial Biopsy  An endometrial biopsy is a procedure to remove tissue samples from the endometrium, which is the lining of the uterus. The tissue that is removed can then be checked under a microscope for disease. This procedure is used to diagnose conditions such as endometrial cancer, endometrial tuberculosis, polyps, or other inflammatory conditions. This procedure may also be used to investigate uterine bleeding to determine where you are in your menstrual cycle or how your hormone levels are affecting the lining of the uterus. Tell a health care provider about:  Any allergies you have.  All medicines you are taking, including vitamins, herbs, eye drops, creams, and over-the-counter medicines.  Any problems you or family members have had with anesthetic medicines.  Any blood disorders you have.  Any surgeries you have had.  Any medical conditions you have.  Whether you are pregnant or may be pregnant. What are the risks? Generally, this is a safe procedure. However, problems may occur, including:  Bleeding.  Pelvic infection.  Puncture of the wall of the uterus with the biopsy device (rare).  Allergic reactions to medicines. What happens before the procedure?  Keep a record of your menstrual cycles as told by your health care provider. You may need to schedule your procedure for a specific time in your cycle.  You may want to bring a sanitary pad to wear after the procedure.  Plan to have someone take you home from the hospital or clinic.  Ask your health care provider about: ? Changing or stopping your regular medicines. This is especially important if you are taking diabetes medicines, arthritis medicines, or blood thinners. ? Taking medicines such as aspirin and ibuprofen. These medicines can thin your blood. Do not take these medicines unless your health care provider tells you to take them. ? Taking over-the-counter medicines, vitamins, herbs, and  supplements. What happens during the procedure?  You will lie on an exam table with your feet and legs supported as in a pelvic exam.  Your health care provider will insert an instrument (speculum) into your vagina to see your cervix.  Your cervix will be cleansed with an antiseptic solution.  A medicine (local anesthetic) will be used to numb the cervix.  A forceps instrument (tenaculum) will be used to hold your cervix steady for the biopsy.  A thin, rod-like instrument (uterine sound) will be inserted through your cervix to determine the length of your uterus and the location where the biopsy sample will be removed.  A thin, flexible tube (catheter) will be inserted through your cervix and into the uterus. The catheter will be used to collect the biopsy sample from your endometrial tissue.  The catheter and speculum will then be removed, and the tissue sample will be sent to a lab for examination. The procedure may vary among health care providers and hospitals. What can I expect after procedure?  You will rest in a recovery area until you are ready to go home.  You may have mild cramping and a small amount of vaginal bleeding. This is normal.  You may have a small amount of vaginal bleeding for a few days. This is normal.  It is up to you to get the results of your procedure. Ask your health care provider, or the department that is doing the procedure, when your results will be ready. Follow these instructions at home:  Take over-the-counter and prescription medicines only as told by your health care provider.  Do not douche, use tampons, or have   sexual intercourse until your health care provider approves.  Return to your normal activities as told by your health care provider. Ask your health care provider what activities are safe for you.  Follow instructions from your health care provider about any activity restrictions, such as restrictions on strenuous exercise or heavy  lifting.  Keep all follow-up visits. This is important. Contact a health care provider:  You have heavy bleeding, or bleed for longer than 2 days after the procedure.  You have bad smelling discharge from your vagina.  You have a fever or chills.  You have a burning sensation when urinating or you have difficulty urinating.  You have severe pain in your lower abdomen. Get help right away if you:  You have severe cramps in your stomach or back.  You pass large blood clots.  Your bleeding increases.  You become weak or light-headed, or you faint or lose consciousness. Summary  An endometrial biopsy is a procedure to remove tissue samples is taken from the endometrium, which is the lining of the uterus.  The tissue sample that is removed will be checked under a microscope for disease.  This procedure is used to diagnose conditions such as endometrial cancer, endometrial tuberculosis, polyps, or other inflammatory conditions.  After the procedure, it is common to have mild cramping and a small amount of vaginal bleeding for a few days.  Do not douche, use tampons, or have sexual intercourse until your health care provider approves. Ask your health care provider which activities are safe for you. This information is not intended to replace advice given to you by your health care provider. Make sure you discuss any questions you have with your health care provider. Document Revised: 10/21/2019 Document Reviewed: 10/21/2019 Elsevier Patient Education  2021 Elsevier Inc. Postmenopausal Bleeding Postmenopausal bleeding is any bleeding that occurs after menopause. Menopause is a time in a woman's life when monthly periods stop. Any type of bleeding after menopause should be checked by your doctor. Treatment will depend on the cause. This kind of bleeding can be caused by:  Taking hormones during menopause.  Low or high amounts of female hormones in the body. This can cause the  lining of the womb (uterus) to become too thin or too thick.  Cancer.  Growths in the womb that are not cancer. Follow these instructions at home:  Watch for any changes in your symptoms. Let your doctor know about them.  Avoid using tampons and douches as told by your doctor.  Change your pads regularly.  Get regular pelvic exams. This includes Pap tests.  Take iron pills as told by your doctor.  Take over-the-counter and prescription medicines only as told by your doctor.  Keep all follow-up visits.   Contact a doctor if:  You have new bleeding from the vagina after menopause.  You have pain in your belly (abdomen). Get help right away if:  You have a fever or chills.  You have very bad pain with bleeding.  You have clumps of blood (blood clots) coming from your vagina.  You have a lot of bleeding, and: ? You use more than 1 pad an hour. ? This kind of bleeding has never happened before.  You have headaches.  You feel dizzy or you feel like you are going to pass out (faint). Summary  Any type of bleeding after menopause should be checked by your doctor.  Avoid using tampons or douches.  Get regular pelvic exams. This includes Pap tests.    Contact a doctor if you have new bleeding or pain in your belly.  Watch for any changes in your symptoms. Let your doctor know about them. This information is not intended to replace advice given to you by your health care provider. Make sure you discuss any questions you have with your health care provider. Document Revised: 09/12/2019 Document Reviewed: 09/12/2019 Elsevier Patient Education  2021 Elsevier Inc.  

## 2020-06-11 NOTE — Progress Notes (Signed)
Pt states had no cycle x 2 years. Vaginal bleeding started in Nov. 2021 off and on ranging from spotting to heavy bleeding. Pt felt dizzy and lightheaded and weak and nauseated with bleeding. Pt denies passing out. Husband at bedside.  Last Pap 2019, normal per pt, but had "biopsy"  Pt had in [place], [place].

## 2020-06-11 NOTE — Progress Notes (Signed)
Patient ID: [name], female   DOB: 1966-03-07, 55 y.o.   MRN: [id number]  Chief Complaint  Patient presents with  . Vaginal Bleeding    HPI Jacqueline Hodges is a 55 y.o. female.  [id number] Patient's last menstrual period was 07/11/2017. Patient was postmenopausal for 2 years then had an episode of bleeding and was seen in the ED 2/1 stating she had irregular bleeding for 2-3 months. No pain, occasional heavy flow. She was evaluated for this sort of problem with a EMBx done in [place] a few years ago and result was benign HPI  Past Medical History:  Diagnosis Date  . Abnormal perimenopausal bleeding 02/20/2017  . Cervical radiculopathy 01/12/2017  . Fibroid uterus 02/20/2017  . Hypertension   . Migraine with aura and without status migrainosus, not intractable 01/12/2017    Past Surgical History:  Procedure Laterality Date  . CESAREAN SECTION      Family History  Problem Relation Age of Onset  . Heart failure Mother   . Diabetes Mother   . High Cholesterol Mother     Social History Social History   Tobacco Use  . Smoking status: Never Smoker  . Smokeless tobacco: Never Used  Vaping Use  . Vaping Use: Never used  Substance Use Topics  . Alcohol use: No  . Drug use: No    No Known Allergies  Current Outpatient Medications  Medication Sig Dispense Refill  . lisinopril-hydrochlorothiazide (ZESTORETIC) 20-12.5 MG tablet Take 1 tablet by mouth daily. 30 tablet 0  . aspirin EC 325 MG tablet Take 650 mg by mouth daily as needed (chest pain). (Patient not taking: Reported on 06/11/2020)     No current facility-administered medications for this visit.    Review of Systems Review of Systems  Constitutional: Negative.   Respiratory: Negative.   Gastrointestinal: Negative.   Genitourinary: Negative.     Blood pressure (!) 158/100, pulse 76, weight 200 lb 11.2 oz (91 kg), last menstrual period 07/11/2017.  Physical Exam Physical Exam Vitals and nursing note reviewed.  Exam conducted with a chaperone present.  Genitourinary:    General: Normal vulva.     Vagina: Normal.     Cervix: Normal.     Uterus: Normal.      Adnexa: Right adnexa normal and left adnexa normal.     Data Reviewed Narrative & Impression  CLINICAL DATA:  Vaginal bleeding.  Postmenopausal.  EXAM: TRANSABDOMINAL AND TRANSVAGINAL ULTRASOUND OF PELVIS  TECHNIQUE: Both transabdominal and transvaginal ultrasound examinations of the pelvis were performed. Transabdominal technique was performed for global imaging of the pelvis including uterus, ovaries, adnexal regions, and pelvic cul-de-sac. It was necessary to proceed with endovaginal exam following the transabdominal exam to visualize the ovaries.  COMPARISON:  Pelvic ultrasound dated January 28, 2017.  FINDINGS: Uterus  Measurements: 11.1 x 6.2 x 7.4 cm = volume: 268 mL. 2.5 x 2.2 x 1.8 cm intramural fibroid in the left anterior fundus, decreased in size since the prior study. 3.0 x 3.0 x 2.9 cm intramural fibroid in the right posterior fundus, increased in size since the prior study.  Endometrium  Thickness: 19 mm. 1.1 x 1.0 x 1.9 cm round anechoic area associated with the endometrium at the uterine fundus  Right ovary  Measurements: 4.0 x 2.6 x 2.1 cm = volume: 11 mL. 3.0 x 1.7 x 2.1 cm simple cyst.  Left ovary  Not visualized.  Other findings  Trace free fluid in the pelvis.  IMPRESSION: 1. Thickened endometrium measuring 19  mm. In the setting of post-menopausal bleeding, endometrial sampling is indicated to exclude carcinoma. If results are benign, sonohysterogram should be considered for focal lesion work-up. (Ref: Radiological Reasoning: Algorithmic Workup of Abnormal Vaginal Bleeding with Endovaginal Sonography and Sonohysterography. AJR 2008; 161:W96-04) 2. 1.9 cm round anechoic area associated with the endometrium at the uterine fundus could reflect a subendometrial cyst. 3. Two  intramural fibroids again noted, one of which has mildly increased in size since 2018. 4. 3.0 cm benign simple cyst in the right ovary. No followup imaging recommended. Note: This recommendation does not apply to premenarchal patients or to those with increased risk (genetic, family history, elevated tumor markers or other high-risk factors) of ovarian cancer. Reference: Radiology 2019 Nov; 293(2):359-371.   Electronically Signed   By: Titus Dubin M.D.   On: 05/12/2020 12:43      Assessment Postmenopausal bleeding with thickened endometrium   Plan F/u on Bx result. See separate note    Emeterio Reeve 06/11/2020, 4:28 PM

## 2020-06-15 LAB — SURGICAL PATHOLOGY

## 2020-06-18 NOTE — Progress Notes (Signed)
Tissue was benign

## 2020-06-22 ENCOUNTER — Telehealth: Payer: Self-pay

## 2020-06-22 NOTE — Telephone Encounter (Addendum)
-----   Message from Woodroe Mode, MD sent at 06/18/2020  2:32 PM EST ----- Tissue was benign   Called pt; VM left stating results from recent visit with our office are normal and pt may call with any follow up questions.

## 2020-07-08 ENCOUNTER — Encounter: Payer: Self-pay | Admitting: General Practice

## 2021-01-30 ENCOUNTER — Inpatient Hospital Stay (HOSPITAL_BASED_OUTPATIENT_CLINIC_OR_DEPARTMENT_OTHER)
Admission: EM | Admit: 2021-01-30 | Discharge: 2021-02-02 | DRG: 282 | Disposition: A | Discharge status: Home / Self Care | Payer: 59 | Attending: Cardiology | Admitting: Cardiology

## 2021-01-30 ENCOUNTER — Other Ambulatory Visit: Payer: Self-pay

## 2021-01-30 ENCOUNTER — Emergency Department (HOSPITAL_BASED_OUTPATIENT_CLINIC_OR_DEPARTMENT_OTHER): Payer: 59 | Admitting: Radiology

## 2021-01-30 ENCOUNTER — Encounter (HOSPITAL_BASED_OUTPATIENT_CLINIC_OR_DEPARTMENT_OTHER): Payer: Self-pay

## 2021-01-30 DIAGNOSIS — Z8249 Family history of ischemic heart disease and other diseases of the circulatory system: Secondary | ICD-10-CM

## 2021-01-30 DIAGNOSIS — I214 Non-ST elevation (NSTEMI) myocardial infarction: Principal | ICD-10-CM | POA: Diagnosis present

## 2021-01-30 DIAGNOSIS — Z20822 Contact with and (suspected) exposure to covid-19: Secondary | ICD-10-CM | POA: Diagnosis present

## 2021-01-30 DIAGNOSIS — Z79899 Other long term (current) drug therapy: Secondary | ICD-10-CM | POA: Diagnosis not present

## 2021-01-30 DIAGNOSIS — E782 Mixed hyperlipidemia: Secondary | ICD-10-CM | POA: Diagnosis not present

## 2021-01-30 DIAGNOSIS — E669 Obesity, unspecified: Secondary | ICD-10-CM | POA: Diagnosis present

## 2021-01-30 DIAGNOSIS — Z7982 Long term (current) use of aspirin: Secondary | ICD-10-CM | POA: Diagnosis not present

## 2021-01-30 DIAGNOSIS — Z23 Encounter for immunization: Secondary | ICD-10-CM | POA: Diagnosis not present

## 2021-01-30 DIAGNOSIS — I249 Acute ischemic heart disease, unspecified: Secondary | ICD-10-CM | POA: Insufficient documentation

## 2021-01-30 DIAGNOSIS — Z6835 Body mass index (BMI) 35.0-35.9, adult: Secondary | ICD-10-CM | POA: Diagnosis not present

## 2021-01-30 DIAGNOSIS — E785 Hyperlipidemia, unspecified: Secondary | ICD-10-CM

## 2021-01-30 DIAGNOSIS — I10 Essential (primary) hypertension: Secondary | ICD-10-CM | POA: Diagnosis present

## 2021-01-30 DIAGNOSIS — I252 Old myocardial infarction: Secondary | ICD-10-CM | POA: Diagnosis present

## 2021-01-30 DIAGNOSIS — I2510 Atherosclerotic heart disease of native coronary artery without angina pectoris: Secondary | ICD-10-CM | POA: Diagnosis not present

## 2021-01-30 DIAGNOSIS — E668 Other obesity: Secondary | ICD-10-CM | POA: Diagnosis not present

## 2021-01-30 DIAGNOSIS — R079 Chest pain, unspecified: Secondary | ICD-10-CM | POA: Insufficient documentation

## 2021-01-30 DIAGNOSIS — E118 Type 2 diabetes mellitus with unspecified complications: Secondary | ICD-10-CM

## 2021-01-30 DIAGNOSIS — Z833 Family history of diabetes mellitus: Secondary | ICD-10-CM

## 2021-01-30 DIAGNOSIS — E119 Type 2 diabetes mellitus without complications: Secondary | ICD-10-CM | POA: Diagnosis present

## 2021-01-30 HISTORY — DX: Precordial pain: R07.2

## 2021-01-30 LAB — PROTIME-INR
INR: 1 (ref 0.8–1.2)
Prothrombin Time: 13 seconds (ref 11.4–15.2)

## 2021-01-30 LAB — CBC
HCT: 38.2 % (ref 36.0–46.0)
Hemoglobin: 12.9 g/dL (ref 12.0–15.0)
MCH: 26.9 pg (ref 26.0–34.0)
MCHC: 33.8 g/dL (ref 30.0–36.0)
MCV: 79.7 fL — ABNORMAL LOW (ref 80.0–100.0)
Platelets: 270 10*3/uL (ref 150–400)
RBC: 4.79 MIL/uL (ref 3.87–5.11)
RDW: 13.9 % (ref 11.5–15.5)
WBC: 8.6 10*3/uL (ref 4.0–10.5)
nRBC: 0 % (ref 0.0–0.2)

## 2021-01-30 LAB — HEPARIN LEVEL (UNFRACTIONATED): Heparin Unfractionated: 0.3 IU/mL (ref 0.30–0.70)

## 2021-01-30 LAB — BASIC METABOLIC PANEL
Anion gap: 9 (ref 5–15)
BUN: 13 mg/dL (ref 6–20)
CO2: 26 mmol/L (ref 22–32)
Calcium: 9.7 mg/dL (ref 8.9–10.3)
Chloride: 103 mmol/L (ref 98–111)
Creatinine, Ser: 0.91 mg/dL (ref 0.44–1.00)
GFR, Estimated: >60 mL/min (ref >60)
Glucose, Bld: 134 mg/dL — ABNORMAL HIGH (ref 70–99)
Potassium: 3.8 mmol/L (ref 3.5–5.1)
Sodium: 138 mmol/L (ref 135–145)

## 2021-01-30 LAB — TROPONIN I (HIGH SENSITIVITY)
Troponin I (High Sensitivity): 296 ng/L (ref <18)
Troponin I (High Sensitivity): 301 ng/L (ref <18)

## 2021-01-30 LAB — RESP PANEL BY RT-PCR (FLU A&B, COVID) ARPGX2
Influenza A by PCR: NEGATIVE
Influenza B by PCR: NEGATIVE
SARS Coronavirus 2 by RT PCR: NEGATIVE

## 2021-01-30 MED ORDER — IBUPROFEN 400 MG PO TABS
600.0000 mg | ORAL_TABLET | Freq: Once | ORAL | Status: AC
  Administered 2021-01-30: 600 mg via ORAL
  Filled 2021-01-30: qty 1

## 2021-01-30 MED ORDER — NITROGLYCERIN 0.4 MG SL SUBL
0.4000 mg | SUBLINGUAL_TABLET | SUBLINGUAL | Status: DC | PRN
  Administered 2021-02-01: 0.4 mg via SUBLINGUAL
  Filled 2021-01-30: qty 1

## 2021-01-30 MED ORDER — HEPARIN (PORCINE) 25000 UT/250ML-% IV SOLN
1400.0000 [IU]/h | INTRAVENOUS | Status: DC
  Administered 2021-01-30: 950 [IU]/h via INTRAVENOUS
  Administered 2021-01-31: 1200 [IU]/h via INTRAVENOUS
  Administered 2021-02-01: 1400 [IU]/h via INTRAVENOUS
  Filled 2021-01-30 (×3): qty 250

## 2021-01-30 MED ORDER — ACETAMINOPHEN 325 MG PO TABS
650.0000 mg | ORAL_TABLET | ORAL | Status: DC | PRN
  Administered 2021-01-30: 650 mg via ORAL
  Filled 2021-01-30: qty 2

## 2021-01-30 MED ORDER — ASPIRIN EC 81 MG PO TBEC
81.0000 mg | DELAYED_RELEASE_TABLET | Freq: Every day | ORAL | Status: DC
  Administered 2021-01-31 – 2021-02-02 (×2): 81 mg via ORAL
  Filled 2021-01-30 (×3): qty 1

## 2021-01-30 MED ORDER — INFLUENZA VAC SPLIT QUAD 0.5 ML IM SUSY: 0.5000 mL | PREFILLED_SYRINGE | INTRAMUSCULAR | Status: DC

## 2021-01-30 MED ORDER — ONDANSETRON HCL 4 MG/2ML IJ SOLN: 4.0000 mg | Freq: Four times a day (QID) | INTRAMUSCULAR | Status: DC | PRN

## 2021-01-30 MED ORDER — NITROGLYCERIN 0.4 MG SL SUBL: 0.4000 mg | SUBLINGUAL_TABLET | SUBLINGUAL | Status: DC | PRN

## 2021-01-30 MED ORDER — CARVEDILOL 6.25 MG PO TABS
6.2500 mg | ORAL_TABLET | Freq: Two times a day (BID) | ORAL | Status: DC
  Administered 2021-01-30 – 2021-02-02 (×6): 6.25 mg via ORAL
  Filled 2021-01-30 (×6): qty 1

## 2021-01-30 MED ORDER — ASPIRIN 325 MG PO TABS
325.0000 mg | ORAL_TABLET | Freq: Every day | ORAL | Status: DC
  Administered 2021-01-30: 325 mg via ORAL
  Filled 2021-01-30: qty 1

## 2021-01-30 MED ORDER — HEPARIN SODIUM (PORCINE) 5000 UNIT/ML IJ SOLN
4000.0000 [IU] | Freq: Once | INTRAMUSCULAR | Status: AC
  Administered 2021-01-30: 4000 [IU] via INTRAVENOUS
  Filled 2021-01-30: qty 1

## 2021-01-30 MED ORDER — LISINOPRIL 10 MG PO TABS
10.0000 mg | ORAL_TABLET | Freq: Every day | ORAL | Status: DC
  Administered 2021-01-30 – 2021-02-02 (×4): 10 mg via ORAL
  Filled 2021-01-30 (×4): qty 1

## 2021-01-30 NOTE — ED Notes (Signed)
I have just given report to [place] with [facility].

## 2021-01-30 NOTE — ED Notes (Signed)
I have just called report to Bonnita Nasuti, RN at [facility] comfortable and in no distress.

## 2021-01-30 NOTE — Progress Notes (Signed)
ANTICOAGULATION CONSULT NOTE - Initial Consult  Pharmacy Consult for Heparin Indication: chest pain/ACS  No Known Allergies  Patient Measurements: Height: 5\' 5"  (165.1 cm) Weight: 96.7 kg (213 lb 3 oz) IBW/kg (Calculated) : 57 Heparin Dosing Weight: 78.9 kg  Vital Signs: Temp: 98.7 F (37.1 C) (10/22 0852) Temp Source: Oral (10/22 0852) BP: 158/86 (10/22 1045) Pulse Rate: 71 (10/22 1045)  Labs: Recent Labs    01/30/21 0912  HGB 12.9  HCT 38.2  PLT 270  CREATININE 0.91  TROPONINIHS 296*    Estimated Creatinine Clearance: 80.4 mL/min (by C-G formula based on SCr of 0.91 mg/dL).   Medical History: Past Medical History:  Diagnosis Date   Abnormal perimenopausal bleeding 02/20/2017   Cervical radiculopathy 01/12/2017   Fibroid uterus 02/20/2017   Hypertension    Migraine with aura and without status migrainosus, not intractable 01/12/2017   Precordial chest pain     Medications:  (Not in a hospital admission)  Scheduled:   aspirin  325 mg Oral Daily   heparin  4,000 Units Intravenous Once   Infusions:   heparin     PRN:   Assessment: 67 yof with a history of htn. Patient is presenting with chest pain x 2 days. Heparin per pharmacy consult placed for chest pain/ACS.  Patient is on not on anticoagulation prior to arrival.  Hgb 12.9; plt 270 PT / INR 13.5 / 1.1  Goal of Therapy:  Heparin level 0.3-0.7 units/ml Monitor platelets by anticoagulation protocol: Yes   Plan:  Give 4000 units bolus x 1 Start heparin infusion at 950 units/hr Check anti-Xa level in 6 hours and daily while on heparin Continue to monitor H&H and platelets  Lorelei Pont, PharmD, BCPS 01/30/2021 11:15 AM ED Clinical Pharmacist -  [phone]

## 2021-01-30 NOTE — H&P (Signed)
. Cardiology Admission History and Physical:   Patient ID: Jacqueline Hodges MRN: [id number] DOB/AGE: 01/06/1966 55 y.o.  Admit date: 01/30/2021 Primary Physician   Pcp, No Primary Cardiologist   None Chief Complaint    chest pain  Assessment and Plan:  NSTEMI Hypertension Obesity  -continue heparin gtt -asa 81 mg daily, atorvastatin 80 mg qhs -lipid panel, A1C -Cardiac catheterization on 10/24, NPO past midnight 10/23 -PRN sublingual nitro -echocardiogram -continue home lisinopril, increased to 10 mg daily (hypertensive). Hold hctz -start coreg 6.25 mg bid  History of Present Illness:  55 year old female transferred from outside hospital for further management of NSTEMI.  Past medical history: hypertension, obesity, no prior CAD/MI/stroke hx. 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 It was non radiating, sharp, and resolved with rest. Her pain recurred today and was more progressive. She went to [facility] ER.   EKG here does not show significant ST-T wave changes. Troponin I elevated 296 and 301. Renal functional is normal. No signs of heart failure. Currently chest pain free.  Relevant CV studies:  EKG:  As above  Echo NA  Cardiac catheterization NA  Nuclear stress test NA  Cardiac MRI/PET NA  CV surgery Na  EP/devices NA   Past Medical History:  Diagnosis Date   Abnormal perimenopausal bleeding 02/20/2017   Cervical radiculopathy 01/12/2017   Fibroid uterus 02/20/2017   Hypertension    Migraine with aura and without status migrainosus, not intractable 01/12/2017   Precordial chest pain     Past Surgical History:  Procedure Laterality Date   CESAREAN SECTION      No Known Allergies No current facility-administered medications on file prior to encounter.   Current Outpatient Medications on File Prior to Encounter  Medication Sig Dispense Refill    aspirin EC 325 MG tablet Take 650 mg by mouth daily as needed (chest pain). (Patient not taking: Reported on 06/11/2020)     lisinopril-hydrochlorothiazide (ZESTORETIC) 20-12.5 MG tablet Take 1 tablet by mouth daily. 30 tablet 0   Social History   Socioeconomic History   Marital status: Single    Spouse name: Not on file   Number of children: Not on file   Years of education: Not on file   Highest education level: Not on file  Occupational History   Not on file  Tobacco Use   Smoking status: Never   Smokeless tobacco: Never  Vaping Use   Vaping Use: Never used  Substance and Sexual Activity   Alcohol use: No   Drug use: No   Sexual activity: Yes    Birth control/protection: Surgical    Comment: BTL 1994  Other Topics Concern   Not on file  Social History Narrative   Not on file   Social Determinants of Health   Financial Resource Strain: Not on file  Food Insecurity: Not on file  Transportation Needs: Not on file  Physical Activity: Not on file  Stress: Not on file  Social Connections: Not on file  Intimate Partner Violence: Not on file    Family History  Problem Relation Age of Onset   Heart failure Mother    Diabetes Mother    High Cholesterol Mother      Review of Systems: [y] = yes, [ ]  = no      General: Weight gain [] ; Weight loss [ ] ; Anorexia [ ] ; Fatigue [ ] ; Fever [ ] ;  Chills [ ] ; Weakness [ ]    Cardiac: Chest pain/pressure [ ] ; Resting SOB [ ] ; Exertional SOB [ ] ; Orthopnea [ ] ; Pedal Edema [ ] ; Palpitations [ ] ; Syncope [ ] ; Presyncope [ ] ; Paroxysmal nocturnal dyspnea[ ]    Pulmonary: Cough [ ] ; Wheezing[ ] ; Hemoptysis[ ] ; Sputum [ ] ; Snoring [ ]    GI: Vomiting[ ] ; Dysphagia[ ] ; Melena[ ] ; Hematochezia [ ] ; Heartburn[ ] ; Abdominal pain [ ] ; Constipation [ ] ; Diarrhea [ ] ; BRBPR [ ]    GU: Hematuria[ ] ; Dysuria [ ] ; Nocturia[ ]  Vascular: Pain in legs with walking [ ] ; Pain in feet with lying flat [ ] ; Non-healing sores [ ] ; Stroke [ ] ; TIA [ ] ; Slurred  speech [ ] ;   Neuro: Headaches[ ] ; Vertigo[ ] ; Seizures[ ] ; Paresthesias[ ] ;Blurred vision [ ] ; Diplopia [ ] ; Vision changes [ ]    Ortho/Skin: Arthritis [ ] ; Joint pain [ ] ; Muscle pain [ ] ; Joint swelling [ ] ; Back Pain [ ] ; Rash [ ]    Psych: Depression[ ] ; Anxiety[ ]    Heme: Bleeding problems [ ] ; Clotting disorders [ ] ; Anemia [ ]    Endocrine: Diabetes [ ] ; Thyroid dysfunction[ ]   Physical Exam/Data:  Blood pressure (!) 154/81, pulse 82, temperature 98.6 F (37 C), temperature source Oral, resp. rate 16, height 5\' 5"  (1.651 m), weight 96.7 kg, last menstrual period 07/11/2017, SpO2 96 %.   GENERAL: Patient is afebrile, Vital signs reviewed, Well appearing, Patient appears comfortable, Alert and lucid. EYES: Normal inspection. HEENT:  normocephalic, atraumatic , normal ENT inspection. ORAL:  Moist NECK:  supple , normal inspection. CARD:  S1S2, heart sounds normal. RESP:  no respiratory distress, breath sounds normal. ABD: soft, tender to palpation, BS present, soft, no organomegaly or masses . BACK: non-tender. No CVA tenderness. MUSC:  normal ROM, non-tender , no pedal edema . SKIN: color normal, no rash, warm, dry . NEURO: awake & alert, lucid, no motor/sensory deficit. Gait stable. PSYCH: mood/affect normal.   Labs: Lab Results  Component Value Date   BUN 13 01/30/2021   Lab Results  Component Value Date   CREATININE 0.91 01/30/2021   Lab Results  Component Value Date   NA 138 01/30/2021   K 3.8 01/30/2021   CL 103 01/30/2021   CO2 26 01/30/2021   No results found for: TROPONINI Lab Results  Component Value Date   WBC 8.6 01/30/2021   HGB 12.9 01/30/2021   HCT 38.2 01/30/2021   MCV 79.7 (L) 01/30/2021   PLT 270 01/30/2021   No results found for: CHOL, HDL, LDLCALC, LDLDIRECT, TRIG, CHOLHDL Lab Results  Component Value Date   ALT 17 07/14/2017   AST 19 07/14/2017   ALKPHOS 52 07/14/2017   BILITOT 0.4 07/14/2017      Radiology: DG Chest 2  View  Result Date: 01/30/2021 CLINICAL DATA:  Chest pain. EXAM: CHEST - 2 VIEW COMPARISON:  11/30/2017 FINDINGS: The heart size and mediastinal contours are within normal limits. Both lungs are clear. The visualized skeletal structures are unremarkable. IMPRESSION: No active cardiopulmonary disease. Electronically Signed   By: Kerby Moors M.D.   On: 01/30/2021 10:10      Signed: Delight Hoh 01/30/2021, 10:36 PM

## 2021-01-30 NOTE — Progress Notes (Signed)
ANTICOAGULATION CONSULT NOTE  Pharmacy Consult for Heparin Indication: chest pain/ACS  No Known Allergies  Patient Measurements: Height: 5\' 5"  (165.1 cm) Weight: 96.7 kg (213 lb 3 oz) IBW/kg (Calculated) : 57 Heparin Dosing Weight: 78.9 kg  Vital Signs: Temp: 98.6 F (37 C) (10/22 1630) Temp Source: Oral (10/22 1630) BP: 160/90 (10/22 1630) Pulse Rate: 66 (10/22 1630)  Labs: Recent Labs    01/30/21 0912 01/30/21 1059 01/30/21 1100 01/30/21 1651  HGB 12.9  --   --   --   HCT 38.2  --   --   --   PLT 270  --   --   --   LABPROT  --   --  13.0  --   INR  --   --  1.0  --   HEPARINUNFRC  --   --   --  0.30  CREATININE 0.91  --   --   --   TROPONINIHS 296* 301*  --   --      Estimated Creatinine Clearance: 80.4 mL/min (by C-G formula based on SCr of 0.91 mg/dL).   Medical History: Past Medical History:  Diagnosis Date   Abnormal perimenopausal bleeding 02/20/2017   Cervical radiculopathy 01/12/2017   Fibroid uterus 02/20/2017   Hypertension    Migraine with aura and without status migrainosus, not intractable 01/12/2017   Precordial chest pain     Medications:  Medications Prior to Admission  Medication Sig Dispense Refill Last Dose   aspirin EC 325 MG tablet Take 650 mg by mouth daily as needed (chest pain). (Patient not taking: Reported on 06/11/2020)      lisinopril-hydrochlorothiazide (ZESTORETIC) 20-12.5 MG tablet Take 1 tablet by mouth daily. 30 tablet 0     Scheduled:   aspirin  325 mg Oral Daily   [START ON 01/31/2021] influenza vac split quadrivalent PF  0.5 mL Intramuscular Tomorrow-1000   Infusions:   heparin 950 Units/hr (01/30/21 1132)   PRN:   Assessment: 38 yof with a history of htn. Patient is presenting with chest pain x 2 days. Heparin per pharmacy consult placed for chest pain/ACS.  Patient is on not on anticoagulation prior to arrival.  Initial heparin level therapeutic at 0.3, on low end.  Goal of Therapy:  Heparin level  0.3-0.7 units/ml Monitor platelets by anticoagulation protocol: Yes   Plan:  Increase heparin to 1050 units/h Recheck heparin level with am labs  Arrie Senate, PharmD, BCPS, [facility] Clinical Pharmacist [phone] Please check AMION for all [place] numbers 01/30/2021

## 2021-01-30 NOTE — ED Notes (Signed)
Her significant other Joseph Berkshire [phone].

## 2021-01-30 NOTE — ED Notes (Signed)
CRITICAL VALUE STICKER  CRITICAL VALUE:trop 296  RECEIVER (on-site recipient of call):Ajax Schroll [place] NOTIFIED: 01/30/2021 1039  MESSENGER (representative from lab):  MD NOTIFIED: hong TIME OF NOTIFICATION:1040  RESPONSE:

## 2021-01-30 NOTE — ED Notes (Signed)
She tells [place] that she still "hurts just a little" (2/10). NTG held per v.o. Dr. Almyra Free.

## 2021-01-30 NOTE — ED Provider Notes (Signed)
[facility] EMERGENCY DEPT Provider Note   CSN: [id number] Arrival date & time: 01/30/21  7341     History Chief Complaint  Patient presents with   Chest Pain    Jacqueline Hodges is a 55 y.o. female.  Patient presents with chief complaint of mid chest pain.  Describes a sharp and aching pain off and on for the past 2 days.  She states this pain is exacerbated whenever she moves her arms a certain way such as pulling or pushing on something.  Pain is improved on rest.  Currently denies pain at rest.  She was at work today when she felt sharp pain again in the center of her chest, nonradiating and presented to the ER.  No reports of fevers or cough or vomiting or diarrhea no diaphoresis palpitations or shortness of breath.  Patient states her mother had an MI in her 1s.      Past Medical History:  Diagnosis Date   Abnormal perimenopausal bleeding 02/20/2017   Cervical radiculopathy 01/12/2017   Fibroid uterus 02/20/2017   Hypertension    Migraine with aura and without status migrainosus, not intractable 01/12/2017   Precordial chest pain     Patient Active Problem List   Diagnosis Date Noted   Fibroid uterus 02/20/2017   Abnormal perimenopausal bleeding 02/20/2017   Cervical radiculopathy 01/12/2017   Migraine with aura and without status migrainosus, not intractable 01/12/2017    Past Surgical History:  Procedure Laterality Date   CESAREAN SECTION       OB History     Gravida  3   Para  3   Term  3   Preterm  0   AB  0   Living  3      SAB  0   IAB  0   Ectopic  0   Multiple  0   Live Births  3           Family History  Problem Relation Age of Onset   Heart failure Mother    Diabetes Mother    High Cholesterol Mother     Social History   Tobacco Use   Smoking status: Never   Smokeless tobacco: Never  Vaping Use   Vaping Use: Never used  Substance Use Topics   Alcohol use: No   Drug use: No    Home  Medications Prior to Admission medications   Medication Sig Start Date End Date Taking? Authorizing Provider  aspirin EC 325 MG tablet Take 650 mg by mouth daily as needed (chest pain). Patient not taking: Reported on 06/11/2020    [provider]  lisinopril-hydrochlorothiazide (ZESTORETIC) 20-12.5 MG tablet Take 1 tablet by mouth daily. 05/12/20   Varney Biles, MD    Allergies    Patient has no known allergies.  Review of Systems   Review of Systems  Constitutional:  Negative for fever.  HENT:  Negative for ear pain.   Eyes:  Negative for pain.  Respiratory:  Negative for cough.   Cardiovascular:  Positive for chest pain.  Gastrointestinal:  Negative for abdominal pain.  Genitourinary:  Negative for flank pain.  Musculoskeletal:  Negative for back pain.  Skin:  Negative for rash.  Neurological:  Negative for headaches.   Physical Exam Updated Vital Signs BP (!) 174/85   Pulse 71   Temp 98.7 F (37.1 C) (Oral)   Resp 18   Ht 5\' 5"  (1.651 m)   Wt 96.7 kg   LMP 07/11/2017  SpO2 99%   BMI 35.48 kg/m   Physical Exam Constitutional:      General: She is not in acute distress.    Appearance: Normal appearance.  HENT:     Head: Normocephalic.     Nose: Nose normal.  Eyes:     Extraocular Movements: Extraocular movements intact.  Cardiovascular:     Rate and Rhythm: Normal rate.  Pulmonary:     Effort: Pulmonary effort is normal.  Chest:     Chest wall: Tenderness present.     Comments: Mid chest wall tenderness present that reproduces her pain. Musculoskeletal:        General: Normal range of motion.     Cervical back: Normal range of motion.  Neurological:     General: No focal deficit present.     Mental Status: She is alert. Mental status is at baseline.    ED Results / Procedures / Treatments   Labs (all labs ordered are listed, but only abnormal results are displayed) Labs Reviewed  BASIC METABOLIC PANEL - Abnormal; Notable for the following  components:      Result Value   Glucose, Bld 134 (*)    All other components within normal limits  CBC - Abnormal; Notable for the following components:   MCV 79.7 (*)    All other components within normal limits  TROPONIN I (HIGH SENSITIVITY) - Abnormal; Notable for the following components:   Troponin I (High Sensitivity) 296 (*)    All other components within normal limits  TROPONIN I (HIGH SENSITIVITY) - Abnormal; Notable for the following components:   Troponin I (High Sensitivity) 301 (*)    All other components within normal limits  RESP PANEL BY RT-PCR (FLU A&B, COVID) ARPGX2  PROTIME-INR  PREGNANCY, URINE  HEPARIN LEVEL (UNFRACTIONATED)    EKG EKG Interpretation  Date/Time:  Saturday January 30 2021 08:52:02 EDT Ventricular Rate:  78 PR Interval:  180 QRS Duration: 72 QT Interval:  370 QTC Calculation: 422 R Axis:   4 Text Interpretation: Sinus rhythm Consider left ventricular hypertrophy Confirmed by Thamas Jaegers ([id number]) on 01/30/2021 8:58:48 AM  Radiology DG Chest 2 View  Result Date: 01/30/2021 CLINICAL DATA:  Chest pain. EXAM: CHEST - 2 VIEW COMPARISON:  11/30/2017 FINDINGS: The heart size and mediastinal contours are within normal limits. Both lungs are clear. The visualized skeletal structures are unremarkable. IMPRESSION: No active cardiopulmonary disease. Electronically Signed   By: Kerby Moors M.D.   On: 01/30/2021 10:10    Procedures .Critical Care Performed by: Luna Fuse, MD Authorized by: Luna Fuse, MD   Critical care provider statement:    Critical care time (minutes):  40 Comments:     Chest pain; elevated troponin   Medications Ordered in ED Medications  aspirin tablet 325 mg (325 mg Oral Given 01/30/21 1052)  heparin ADULT infusion 100 units/mL (25000 units/23mL) (950 Units/hr Intravenous New Bag/Given 01/30/21 1132)  ibuprofen (ADVIL) tablet 600 mg (600 mg Oral Given 01/30/21 0941)  heparin injection 4,000 Units (4,000 Units  Intravenous Given 01/30/21 1131)    ED Course  I have reviewed the triage vital signs and the nursing notes.  Pertinent labs & imaging results that were available during my care of the patient were reviewed by me and considered in my medical decision making (see chart for details).    MDM Rules/Calculators/A&P  EKG shows sinus rhythm normal rate no ST elevations, no ST depressions noted.  For set of troponin is positive at 296.  Second appears relatively unchanged at 301.  Case discussed with medicine, recommending cardiology evaluation first.  Case discussed with cardiology who excepted the patient to Dr.Lambert's service.  Final Clinical Impression(s) / ED Diagnoses Final diagnoses:  ACS (acute coronary syndrome) [facility])    Rx / DC Orders ED Discharge Orders     None        Luna Fuse, MD 01/30/21 1223

## 2021-01-30 NOTE — Progress Notes (Signed)
Received a phone call from Facility: [facility]  Requesting MD: [place] Patient with h/o HTN presenting with intermittent chest pain x 2 days.  HS troponin >200, appears to be NSTEMI.  EKG unremarkable.  Started on heparin.   Plan of care: Recommend admission to cardiology service, as patient does not appear to have other medical problems at this time. Requested the transferring physician to call cardiology for admission.     Carlyon Shadow, M.D. [facility]

## 2021-01-31 ENCOUNTER — Inpatient Hospital Stay (HOSPITAL_COMMUNITY): Payer: 59

## 2021-01-31 DIAGNOSIS — I214 Non-ST elevation (NSTEMI) myocardial infarction: Principal | ICD-10-CM

## 2021-01-31 DIAGNOSIS — I200 Unstable angina: Secondary | ICD-10-CM

## 2021-01-31 LAB — ECHOCARDIOGRAM COMPLETE
Area-P 1/2: 2.97 cm2
Height: 65 in
S' Lateral: 2.6 cm
Weight: 3403.9 oz

## 2021-01-31 LAB — LIPID PANEL
Cholesterol: 209 mg/dL — ABNORMAL HIGH (ref 0–200)
HDL: 47 mg/dL (ref >40)
LDL Cholesterol: 139 mg/dL — ABNORMAL HIGH (ref 0–99)
Total CHOL/HDL Ratio: 4.4 RATIO
Triglycerides: 115 mg/dL (ref <150)
VLDL: 23 mg/dL (ref 0–40)

## 2021-01-31 LAB — CBC
HCT: 37.3 % (ref 36.0–46.0)
Hemoglobin: 12.2 g/dL (ref 12.0–15.0)
MCH: 26.8 pg (ref 26.0–34.0)
MCHC: 32.7 g/dL (ref 30.0–36.0)
MCV: 81.8 fL (ref 80.0–100.0)
Platelets: 276 10*3/uL (ref 150–400)
RBC: 4.56 MIL/uL (ref 3.87–5.11)
RDW: 14 % (ref 11.5–15.5)
WBC: 7.3 10*3/uL (ref 4.0–10.5)
nRBC: 0 % (ref 0.0–0.2)

## 2021-01-31 LAB — BASIC METABOLIC PANEL
Anion gap: 7 (ref 5–15)
BUN: 16 mg/dL (ref 6–20)
CO2: 26 mmol/L (ref 22–32)
Calcium: 9 mg/dL (ref 8.9–10.3)
Chloride: 102 mmol/L (ref 98–111)
Creatinine, Ser: 1.06 mg/dL — ABNORMAL HIGH (ref 0.44–1.00)
GFR, Estimated: >60 mL/min (ref >60)
Glucose, Bld: 155 mg/dL — ABNORMAL HIGH (ref 70–99)
Potassium: 3.5 mmol/L (ref 3.5–5.1)
Sodium: 135 mmol/L (ref 135–145)

## 2021-01-31 LAB — HEPARIN LEVEL (UNFRACTIONATED)
Heparin Unfractionated: 0.17 IU/mL — ABNORMAL LOW (ref 0.30–0.70)
Heparin Unfractionated: 0.33 IU/mL (ref 0.30–0.70)
Heparin Unfractionated: 0.24 IU/mL — ABNORMAL LOW (ref 0.30–0.70)

## 2021-01-31 LAB — HIV ANTIBODY (ROUTINE TESTING W REFLEX): HIV Screen 4th Generation wRfx: NONREACTIVE

## 2021-01-31 MED ORDER — SODIUM CHLORIDE 0.9% FLUSH: 3.0000 mL | INTRAVENOUS | Status: DC | PRN

## 2021-01-31 MED ORDER — SODIUM CHLORIDE 0.9 % IV SOLN: 250.0000 mL | INTRAVENOUS | Status: DC | PRN

## 2021-01-31 MED ORDER — SODIUM CHLORIDE 0.9 % WEIGHT BASED INFUSION
1.0000 mL/kg/h | INTRAVENOUS | Status: DC
  Administered 2021-02-01 (×2): 1 mL/kg/h via INTRAVENOUS

## 2021-01-31 MED ORDER — SODIUM CHLORIDE 0.9% FLUSH
3.0000 mL | Freq: Two times a day (BID) | INTRAVENOUS | Status: DC
  Administered 2021-01-31 – 2021-02-01 (×2): 3 mL via INTRAVENOUS

## 2021-01-31 MED ORDER — HEPARIN BOLUS VIA INFUSION
2000.0000 [IU] | Freq: Once | INTRAVENOUS | Status: AC
  Administered 2021-01-31: 2000 [IU] via INTRAVENOUS
  Filled 2021-01-31: qty 2000

## 2021-01-31 MED ORDER — DOCUSATE SODIUM 100 MG PO CAPS
100.0000 mg | ORAL_CAPSULE | Freq: Every day | ORAL | Status: DC | PRN
  Administered 2021-01-31: 100 mg via ORAL
  Filled 2021-01-31: qty 1

## 2021-01-31 MED ORDER — SODIUM CHLORIDE 0.9 % WEIGHT BASED INFUSION
3.0000 mL/kg/h | INTRAVENOUS | Status: DC
  Administered 2021-02-01: 3 mL/kg/h via INTRAVENOUS

## 2021-01-31 MED ORDER — ASPIRIN 81 MG PO CHEW
81.0000 mg | CHEWABLE_TABLET | ORAL | Status: AC
  Administered 2021-02-01: 81 mg via ORAL
  Filled 2021-01-31: qty 1

## 2021-01-31 NOTE — Progress Notes (Signed)
ANTICOAGULATION CONSULT NOTE  Pharmacy Consult for Heparin Indication: chest pain/ACS  No Known Allergies  Patient Measurements: Height: 5\' 5"  (165.1 cm) Weight: 96.5 kg (212 lb 11.9 oz) IBW/kg (Calculated) : 57 Heparin Dosing Weight: 78.9 kg  Vital Signs: Temp: 99.8 F (37.7 C) (10/23 1652) Temp Source: Oral (10/23 1652) BP: 162/89 (10/23 1655) Pulse Rate: 73 (10/23 1655)  Labs: Recent Labs    01/30/21 0912 01/30/21 1059 01/30/21 1100 01/30/21 1651 01/31/21 0206 01/31/21 1028 01/31/21 1632  HGB 12.9  --   --   --  12.2  --   --   HCT 38.2  --   --   --  37.3  --   --   PLT 270  --   --   --  276  --   --   LABPROT  --   --  13.0  --   --   --   --   INR  --   --  1.0  --   --   --   --   HEPARINUNFRC  --   --   --    < > 0.17* 0.33 0.24*  CREATININE 0.91  --   --   --  1.06*  --   --   TROPONINIHS 296* 301*  --   --   --   --   --    < > = values in this interval not displayed.     Estimated Creatinine Clearance: 68.9 mL/min (A) (by C-G formula based on SCr of 1.06 mg/dL (H)).   Medical History: Past Medical History:  Diagnosis Date   Abnormal perimenopausal bleeding 02/20/2017   Cervical radiculopathy 01/12/2017   Fibroid uterus 02/20/2017   Hypertension    Migraine with aura and without status migrainosus, not intractable 01/12/2017   Precordial chest pain     Medications:  Medications Prior to Admission  Medication Sig Dispense Refill Last Dose   amLODipine (NORVASC) 5 MG tablet Take 5 mg by mouth daily.   01/30/2021   diclofenac Sodium (VOLTAREN) 1 % GEL Apply 2 g topically 4 (four) times daily as needed for pain.   Past Week   ibuprofen (ADVIL) 200 MG tablet Take 600 mg by mouth every 6 (six) hours as needed for headache or mild pain.   Past Month   lisinopril-hydrochlorothiazide (ZESTORETIC) 20-25 MG tablet Take 1 tablet by mouth daily.   01/30/2021    Scheduled:   aspirin EC  81 mg Oral Daily   carvedilol  6.25 mg Oral BID WC   influenza vac  split quadrivalent PF  0.5 mL Intramuscular Tomorrow-1000   lisinopril  10 mg Oral Daily   Infusions:   heparin 1,200 Units/hr (01/31/21 1753)   PRN:   Assessment: 55 yo F with a history of HTN. Patient is presenting with chest pain x 2 days. Heparin per pharmacy consult placed for chest pain/ACS.  Patient was not on anticoagulation prior to arrival.  Heparin level now subtherapeutic at 0.24.   Goal of Therapy:  Heparin level 0.3-0.7 units/ml Monitor platelets by anticoagulation protocol: Yes   Plan:  Increase heparin to 1400 units/h Recheck heparin level with daily labs  Arrie Senate, PharmD, BCPS, [facility] Clinical Pharmacist [phone] Please check AMION for all [facility] Pharm[facility] numbers 01/31/2021

## 2021-01-31 NOTE — Progress Notes (Signed)
Progress Note  Patient Name: Jacqueline Hodges Date of Encounter: 01/31/2021  [place] Cardiologist: None   Subjective   NAEO. Chest pain improved now. Family at bedside.  Inpatient Medications    Scheduled Meds:  aspirin EC  81 mg Oral Daily   carvedilol  6.25 mg Oral BID WC   influenza vac split quadrivalent PF  0.5 mL Intramuscular Tomorrow-1000   lisinopril  10 mg Oral Daily   Continuous Infusions:  heparin 1,200 Units/hr (01/31/21 0316)   PRN Meds: acetaminophen, nitroGLYCERIN, ondansetron (ZOFRAN) IV   Vital Signs    Vitals:   01/30/21 1946 01/30/21 2345 01/31/21 0500 01/31/21 0819  BP: (!) 154/81 136/68 114/73 (!) 150/83  Pulse: 82 81 73 72  Resp: 16 15 16    Temp: 98.6 F (37 C)  98.7 F (37.1 C) 99 F (37.2 C)  TempSrc: Oral  Oral Oral  SpO2: 96% 100% 100%   Weight:   96.5 kg   Height:        Intake/Output Summary (Last 24 hours) at 01/31/2021 0848 Last data filed at 01/31/2021 0400 Gross per 24 hour  Intake 190.72 ml  Output --  Net 190.72 ml   Last 3 Weights 01/31/2021 01/30/2021 01/30/2021  Weight (lbs) 212 lb 11.9 oz 213 lb 3 oz 213 lb 3 oz  Weight (kg) 96.5 kg 96.7 kg 96.7 kg      Telemetry     - Personally Reviewed  ECG     - Personally Reviewed  Physical Exam   GEN: No acute distress.   Obese. Neck: No JVD Cardiac: RRR, no murmurs, rubs, or gallops.  Respiratory: Clear to auscultation bilaterally. GI: Soft, nontender, non-distended  MS: No edema; No deformity. Neuro:  Nonfocal  Psych: Normal affect   Labs    High Sensitivity Troponin:   Recent Labs  Lab 01/30/21 0912 01/30/21 1059  TROPONINIHS 296* 301*     Chemistry Recent Labs  Lab 01/30/21 0912 01/31/21 0206  NA 138 135  K 3.8 3.5  CL 103 102  CO2 26 26  GLUCOSE 134* 155*  BUN 13 16  CREATININE 0.91 1.06*  CALCIUM 9.7 9.0  GFRNONAA >60 >60  ANIONGAP 9 7    Lipids  Recent Labs  Lab 01/31/21 0206  CHOL 209*  TRIG 115  HDL 47  LDLCALC 139*   CHOLHDL 4.4    Hematology Recent Labs  Lab 01/30/21 0912 01/31/21 0206  WBC 8.6 7.3  RBC 4.79 4.56  HGB 12.9 12.2  HCT 38.2 37.3  MCV 79.7* 81.8  MCH 26.9 26.8  MCHC 33.8 32.7  RDW 13.9 14.0  PLT 270 276   Thyroid No results for input(s): TSH, FREET4 in the last 168 hours.  BNPNo results for input(s): BNP, PROBNP in the last 168 hours.  DDimer No results for input(s): DDIMER in the last 168 hours.   Radiology    DG Chest 2 View  Result Date: 01/30/2021 CLINICAL DATA:  Chest pain. EXAM: CHEST - 2 VIEW COMPARISON:  11/30/2017 FINDINGS: The heart size and mediastinal contours are within normal limits. Both lungs are clear. The visualized skeletal structures are unremarkable. IMPRESSION: No active cardiopulmonary disease. Electronically Signed   By: Kerby Moors M.D.   On: 01/30/2021 10:10    Cardiac Studies   No new, echo pending  Patient Profile     55 year old female transferred from outside hospital for further management of NSTEMI.   Past medical history: hypertension, obesity, no prior CAD/MI/stroke  hx. Family history of early ASCVD (mother had MI in her 106s)  Assessment & Plan    #NSTEMI - cont heparin gtt - cont aspirin 81 - cont atorva 80 - coreg 6.25mg  PO BID - NPO at [facility] for [facility] tomorrow - echo ordered  #HTN  #Obesity     For questions or updates, please contact [place] Please consult [email or web address] for contact info under        Signed, Vickie Epley, MD  01/31/2021, 8:48 AM

## 2021-01-31 NOTE — Progress Notes (Signed)
ANTICOAGULATION CONSULT NOTE  Pharmacy Consult for Heparin Indication: chest pain/ACS  No Known Allergies  Patient Measurements: Height: 5\' 5"  (165.1 cm) Weight: 96.7 kg (213 lb 3 oz) IBW/kg (Calculated) : 57 Heparin Dosing Weight: 78.9 kg  Vital Signs: Temp: 98.6 F (37 C) (10/22 1946) Temp Source: Oral (10/22 1946) BP: 136/68 (10/22 2345) Pulse Rate: 81 (10/22 2345)  Labs: Recent Labs    01/30/21 0912 01/30/21 1059 01/30/21 1100 01/30/21 1651 01/31/21 0206  HGB 12.9  --   --   --  12.2  HCT 38.2  --   --   --  37.3  PLT 270  --   --   --  276  LABPROT  --   --  13.0  --   --   INR  --   --  1.0  --   --   HEPARINUNFRC  --   --   --  0.30 0.17*  CREATININE 0.91  --   --   --  1.06*  TROPONINIHS 296* 301*  --   --   --      Estimated Creatinine Clearance: 69 mL/min (A) (by C-G formula based on SCr of 1.06 mg/dL (H)).   Medical History: Past Medical History:  Diagnosis Date   Abnormal perimenopausal bleeding 02/20/2017   Cervical radiculopathy 01/12/2017   Fibroid uterus 02/20/2017   Hypertension    Migraine with aura and without status migrainosus, not intractable 01/12/2017   Precordial chest pain     Medications:  Medications Prior to Admission  Medication Sig Dispense Refill Last Dose   aspirin EC 325 MG tablet Take 650 mg by mouth daily as needed (chest pain). (Patient not taking: Reported on 06/11/2020)      lisinopril-hydrochlorothiazide (ZESTORETIC) 20-12.5 MG tablet Take 1 tablet by mouth daily. 30 tablet 0     Scheduled:   aspirin EC  81 mg Oral Daily   carvedilol  6.25 mg Oral BID WC   heparin  2,000 Units Intravenous Once   influenza vac split quadrivalent PF  0.5 mL Intramuscular Tomorrow-1000   lisinopril  10 mg Oral Daily   Infusions:   heparin 1,050 Units/hr (01/30/21 1822)   PRN:   Assessment: 71 yof with a history of htn. Patient is presenting with chest pain x 2 days. Heparin per pharmacy consult placed for chest  pain/ACS.  Patient is on not on anticoagulation prior to arrival.  Initial heparin level therapeutic at 0.3, on low end.  10/23 AM update:  Heparin level low  Goal of Therapy:  Heparin level 0.3-0.7 units/ml Monitor platelets by anticoagulation protocol: Yes   Plan:  Heparin 2000 units re-bol[employer] Inc heparin to 1200 units/hr 1100 heparin level  Narda Bonds, PharmD, [place] Pharmacist Phone: [phone]

## 2021-01-31 NOTE — Progress Notes (Signed)
Echocardiogram 2D Echocardiogram has been performed.  Oneal Deputy Kensi Karr RDCS 01/31/2021, 10:59 AM

## 2021-01-31 NOTE — Progress Notes (Addendum)
ANTICOAGULATION CONSULT NOTE  Pharmacy Consult for Heparin Indication: chest pain/ACS  No Known Allergies  Patient Measurements: Height: 5\' 5"  (165.1 cm) Weight: 96.5 kg (212 lb 11.9 oz) IBW/kg (Calculated) : 57 Heparin Dosing Weight: 78.9 kg  Vital Signs: Temp: 97.8 F (36.6 C) (10/23 1212) Temp Source: Oral (10/23 1212) BP: 150/82 (10/23 1212) Pulse Rate: 71 (10/23 1212)  Labs: Recent Labs    01/30/21 0912 01/30/21 1059 01/30/21 1100 01/30/21 1651 01/31/21 0206 01/31/21 1028  HGB 12.9  --   --   --  12.2  --   HCT 38.2  --   --   --  37.3  --   PLT 270  --   --   --  276  --   LABPROT  --   --  13.0  --   --   --   INR  --   --  1.0  --   --   --   HEPARINUNFRC  --   --   --  0.30 0.17* 0.33  CREATININE 0.91  --   --   --  1.06*  --   TROPONINIHS 296* 301*  --   --   --   --     Estimated Creatinine Clearance: 68.9 mL/min (A) (by C-G formula based on SCr of 1.06 mg/dL (H)).   Medical History: Past Medical History:  Diagnosis Date   Abnormal perimenopausal bleeding 02/20/2017   Cervical radiculopathy 01/12/2017   Fibroid uterus 02/20/2017   Hypertension    Migraine with aura and without status migrainosus, not intractable 01/12/2017   Precordial chest pain     Medications:  Medications Prior to Admission  Medication Sig Dispense Refill Last Dose   aspirin EC 325 MG tablet Take 650 mg by mouth daily as needed (chest pain). (Patient not taking: Reported on 06/11/2020)      lisinopril-hydrochlorothiazide (ZESTORETIC) 20-12.5 MG tablet Take 1 tablet by mouth daily. 30 tablet 0     Scheduled:   aspirin EC  81 mg Oral Daily   carvedilol  6.25 mg Oral BID WC   influenza vac split quadrivalent PF  0.5 mL Intramuscular Tomorrow-1000   lisinopril  10 mg Oral Daily   Infusions:   heparin 1,200 Units/hr (01/31/21 0924)   PRN:   Assessment: 55 yo F with a history of HTN. Patient is presenting with chest pain x 2 days. Heparin per pharmacy consult placed for  chest pain/ACS.  Patient was not on anticoagulation prior to arrival.  Heparin level therapeutic at 0.33, on 1200 units/hr. CBC stable. No line issues or signs/symptoms of bleeding noted per RN. Plans noted for [facility] tomorrow per Cardiology.   Goal of Therapy:  Heparin level 0.3-0.7 units/ml Monitor platelets by anticoagulation protocol: Yes   Plan:  Continue heparin at 1200 units/hr. Check ~6hr heparin level.  Daily heparin level, CBC. Monitor for signs/symptoms of bleeding.     Vance Peper, PharmD PGY1 Pharmacy Resident Phone [phone] 01/31/2021 12:23 PM   Please check AMION for all [place] phone numbers After 10:00 PM, call [place] [phone]

## 2021-02-01 ENCOUNTER — Encounter (HOSPITAL_COMMUNITY)
Admission: EM | Disposition: A | Discharge status: Home / Self Care | Payer: Self-pay | Source: Home / Self Care | Attending: Cardiology

## 2021-02-01 DIAGNOSIS — E782 Mixed hyperlipidemia: Secondary | ICD-10-CM | POA: Diagnosis not present

## 2021-02-01 DIAGNOSIS — I2510 Atherosclerotic heart disease of native coronary artery without angina pectoris: Secondary | ICD-10-CM

## 2021-02-01 DIAGNOSIS — I214 Non-ST elevation (NSTEMI) myocardial infarction: Secondary | ICD-10-CM | POA: Diagnosis not present

## 2021-02-01 DIAGNOSIS — I10 Essential (primary) hypertension: Secondary | ICD-10-CM

## 2021-02-01 HISTORY — PX: LEFT HEART CATH AND CORONARY ANGIOGRAPHY: CATH118249

## 2021-02-01 LAB — CBC
HCT: 36.4 % (ref 36.0–46.0)
Hemoglobin: 12 g/dL (ref 12.0–15.0)
MCH: 27 pg (ref 26.0–34.0)
MCHC: 33 g/dL (ref 30.0–36.0)
MCV: 81.8 fL (ref 80.0–100.0)
Platelets: 276 10*3/uL (ref 150–400)
RBC: 4.45 MIL/uL (ref 3.87–5.11)
RDW: 13.9 % (ref 11.5–15.5)
WBC: 7.6 10*3/uL (ref 4.0–10.5)
nRBC: 0 % (ref 0.0–0.2)

## 2021-02-01 LAB — BASIC METABOLIC PANEL
Anion gap: 7 (ref 5–15)
BUN: 11 mg/dL (ref 6–20)
CO2: 26 mmol/L (ref 22–32)
Calcium: 9 mg/dL (ref 8.9–10.3)
Chloride: 105 mmol/L (ref 98–111)
Creatinine, Ser: 1.03 mg/dL — ABNORMAL HIGH (ref 0.44–1.00)
GFR, Estimated: >60 mL/min (ref >60)
Glucose, Bld: 146 mg/dL — ABNORMAL HIGH (ref 70–99)
Potassium: 4 mmol/L (ref 3.5–5.1)
Sodium: 138 mmol/L (ref 135–145)

## 2021-02-01 LAB — HEPARIN LEVEL (UNFRACTIONATED): Heparin Unfractionated: 0.34 IU/mL (ref 0.30–0.70)

## 2021-02-01 LAB — HEMOGLOBIN A1C
Hgb A1c MFr Bld: 7.8 % — ABNORMAL HIGH (ref 4.8–5.6)
Mean Plasma Glucose: 177.16 mg/dL

## 2021-02-01 SURGERY — LEFT HEART CATH AND CORONARY ANGIOGRAPHY
Anesthesia: LOCAL

## 2021-02-01 MED ORDER — HEPARIN (PORCINE) IN NACL 1000-0.9 UT/500ML-% IV SOLN
INTRAVENOUS | Status: AC
  Filled 2021-02-01: qty 1000

## 2021-02-01 MED ORDER — SODIUM CHLORIDE 0.9 % IV SOLN: 250.0000 mL | INTRAVENOUS | Status: DC | PRN

## 2021-02-01 MED ORDER — VERAPAMIL HCL 2.5 MG/ML IV SOLN
INTRAVENOUS | Status: DC | PRN
  Administered 2021-02-01: 10 mL via INTRA_ARTERIAL

## 2021-02-01 MED ORDER — MIDAZOLAM HCL 2 MG/2ML IJ SOLN
INTRAMUSCULAR | Status: AC
  Filled 2021-02-01: qty 2

## 2021-02-01 MED ORDER — HEPARIN SODIUM (PORCINE) 1000 UNIT/ML IJ SOLN
INTRAMUSCULAR | Status: DC | PRN
  Administered 2021-02-01: 5000 [IU] via INTRAVENOUS

## 2021-02-01 MED ORDER — SODIUM CHLORIDE 0.9% FLUSH
3.0000 mL | Freq: Two times a day (BID) | INTRAVENOUS | Status: DC
  Administered 2021-02-01: 3 mL via INTRAVENOUS

## 2021-02-01 MED ORDER — ONDANSETRON HCL 4 MG/2ML IJ SOLN
4.0000 mg | Freq: Four times a day (QID) | INTRAMUSCULAR | Status: DC | PRN
Start: 2021-02-01 — End: 2021-02-01

## 2021-02-01 MED ORDER — VERAPAMIL HCL 2.5 MG/ML IV SOLN
INTRAVENOUS | Status: AC
  Filled 2021-02-01: qty 2

## 2021-02-01 MED ORDER — HYDRALAZINE HCL 20 MG/ML IJ SOLN: 10.0000 mg | INTRAMUSCULAR | Status: AC | PRN

## 2021-02-01 MED ORDER — LIDOCAINE HCL (PF) 1 % IJ SOLN
INTRAMUSCULAR | Status: AC
  Filled 2021-02-01: qty 30

## 2021-02-01 MED ORDER — AMLODIPINE BESYLATE 5 MG PO TABS: 5.0000 mg | ORAL_TABLET | Freq: Every day | ORAL | Status: DC

## 2021-02-01 MED ORDER — ASPIRIN 81 MG PO CHEW: 81.0000 mg | CHEWABLE_TABLET | Freq: Every day | ORAL | Status: DC

## 2021-02-01 MED ORDER — ACETAMINOPHEN 325 MG PO TABS: 650.0000 mg | ORAL_TABLET | ORAL | Status: DC | PRN

## 2021-02-01 MED ORDER — HEPARIN (PORCINE) IN NACL 1000-0.9 UT/500ML-% IV SOLN
INTRAVENOUS | Status: DC | PRN
  Administered 2021-02-01 (×2): 500 mL

## 2021-02-01 MED ORDER — MORPHINE SULFATE (PF) 2 MG/ML IV SOLN: 2.0000 mg | INTRAVENOUS | Status: DC | PRN

## 2021-02-01 MED ORDER — FENTANYL CITRATE (PF) 100 MCG/2ML IJ SOLN
INTRAMUSCULAR | Status: DC | PRN
  Administered 2021-02-01 (×2): 25 ug via INTRAVENOUS

## 2021-02-01 MED ORDER — SODIUM CHLORIDE 0.9 % IV SOLN: INTRAVENOUS | Status: AC

## 2021-02-01 MED ORDER — ATORVASTATIN CALCIUM 80 MG PO TABS
80.0000 mg | ORAL_TABLET | Freq: Every day | ORAL | Status: DC
  Administered 2021-02-01 – 2021-02-02 (×2): 80 mg via ORAL
  Filled 2021-02-01 (×2): qty 1

## 2021-02-01 MED ORDER — AMLODIPINE BESYLATE 5 MG PO TABS
5.0000 mg | ORAL_TABLET | Freq: Every day | ORAL | Status: DC
  Administered 2021-02-01 – 2021-02-02 (×2): 5 mg via ORAL
  Filled 2021-02-01 (×2): qty 1

## 2021-02-01 MED ORDER — LIDOCAINE HCL (PF) 1 % IJ SOLN
INTRAMUSCULAR | Status: DC | PRN
  Administered 2021-02-01: 2 mL via INTRADERMAL

## 2021-02-01 MED ORDER — MIDAZOLAM HCL 2 MG/2ML IJ SOLN
INTRAMUSCULAR | Status: DC | PRN
  Administered 2021-02-01 (×2): 1 mg via INTRAVENOUS

## 2021-02-01 MED ORDER — LABETALOL HCL 5 MG/ML IV SOLN: 10.0000 mg | INTRAVENOUS | Status: AC | PRN

## 2021-02-01 MED ORDER — IOHEXOL 350 MG/ML SOLN
INTRAVENOUS | Status: DC | PRN
  Administered 2021-02-01: 50 mL via INTRA_ARTERIAL

## 2021-02-01 MED ORDER — FENTANYL CITRATE (PF) 100 MCG/2ML IJ SOLN
INTRAMUSCULAR | Status: AC
  Filled 2021-02-01: qty 2

## 2021-02-01 MED ORDER — SODIUM CHLORIDE 0.9% FLUSH: 3.0000 mL | INTRAVENOUS | Status: DC | PRN

## 2021-02-01 MED ORDER — HEPARIN SODIUM (PORCINE) 1000 UNIT/ML IJ SOLN
INTRAMUSCULAR | Status: AC
  Filled 2021-02-01: qty 1

## 2021-02-01 SURGICAL SUPPLY — 11 items
CATH 5FR JL3.5 JR4 ANG PIG MP (CATHETERS) ×2 IMPLANT
CATH OPTITORQUE TIG 4.0 5F (CATHETERS) ×2 IMPLANT
DEVICE RAD COMP TR BAND LRG (VASCULAR PRODUCTS) ×2 IMPLANT
GLIDESHEATH SLEND A-KIT 6F 22G (SHEATH) ×2 IMPLANT
GUIDEWIRE INQWIRE 1.5J.035X260 (WIRE) ×1 IMPLANT
INQWIRE 1.5J .035X260CM (WIRE) ×2
KIT HEART LEFT (KITS) ×2 IMPLANT
PACK CARDIAC CATHETERIZATION (CUSTOM PROCEDURE TRAY) ×2 IMPLANT
TRANSDUCER W/STOPCOCK (MISCELLANEOUS) ×2 IMPLANT
TUBING CIL FLEX 10 FLL-RA (TUBING) ×2 IMPLANT
WIRE HI TORQ VERSACORE-J 145CM (WIRE) ×2 IMPLANT

## 2021-02-01 NOTE — H&P (View-Only) (Signed)
Progress Note  Patient Name: Jacqueline Hodges Date of Encounter: 02/01/2021  [place] Cardiologist: None   Subjective   Mild chest discomfort overnight, not bad enough to notify nursing per patient.  However, same characteristic as pain that brought her into the hospital.  No chest pain or shortness of breath at present.  No complaints this morning.  Inpatient Medications    Scheduled Meds:  aspirin EC  81 mg Oral Daily   carvedilol  6.25 mg Oral BID WC   influenza vac split quadrivalent PF  0.5 mL Intramuscular Tomorrow-1000   lisinopril  10 mg Oral Daily   sodium chloride flush  3 mL Intravenous Q12H   Continuous Infusions:  sodium chloride     sodium chloride 1 mL/kg/hr (02/01/21 0622)   heparin 1,400 Units/hr (02/01/21 0728)   PRN Meds: sodium chloride, acetaminophen, docusate sodium, nitroGLYCERIN, ondansetron (ZOFRAN) IV, sodium chloride flush   Vital Signs    Vitals:   01/31/21 1655 01/31/21 2051 02/01/21 0518 02/01/21 0550  BP: (!) 162/89 (!) 176/88 (!) 181/107   Pulse: 73 74 80   Resp:  18 18   Temp:  99.4 F (37.4 C) 99.3 F (37.4 C)   TempSrc:  Oral Oral   SpO2:  99% 98%   Weight:    93.8 kg  Height:        Intake/Output Summary (Last 24 hours) at 02/01/2021 0837 Last data filed at 01/31/2021 1956 Gross per 24 hour  Intake 185.65 ml  Output --  Net 185.65 ml   Last 3 Weights 02/01/2021 01/31/2021 01/30/2021  Weight (lbs) 206 lb 14.4 oz 212 lb 11.9 oz 213 lb 3 oz  Weight (kg) 93.849 kg 96.5 kg 96.7 kg      Telemetry    Sinus rhythm without significant arrhythmia- Personally Reviewed  ECG    Normal sinus rhythm, possible age-indeterminate inferior infarct, possible age-indeterminate anterior infarct with poor R wave progression, nonspecific ST and T wave abnormality - Personally Reviewed  Physical Exam  Alert, oriented, no distress GEN: No acute distress.   Neck: No JVD Cardiac: RRR, no murmurs, rubs, or gallops.  Respiratory:  Clear to auscultation bilaterally. GI: Soft, nontender, non-distended  MS: No edema; No deformity.  2+ right radial pulse Neuro:  Nonfocal  Psych: Normal affect   Labs    High Sensitivity Troponin:   Recent Labs  Lab 01/30/21 0912 01/30/21 1059  TROPONINIHS 296* 301*     Chemistry Recent Labs  Lab 01/30/21 0912 01/31/21 0206  NA 138 135  K 3.8 3.5  CL 103 102  CO2 26 26  GLUCOSE 134* 155*  BUN 13 16  CREATININE 0.91 1.06*  CALCIUM 9.7 9.0  GFRNONAA >60 >60  ANIONGAP 9 7    Lipids  Recent Labs  Lab 01/31/21 0206  CHOL 209*  TRIG 115  HDL 47  LDLCALC 139*  CHOLHDL 4.4    Hematology Recent Labs  Lab 01/30/21 0912 01/31/21 0206 02/01/21 0121  WBC 8.6 7.3 7.6  RBC 4.79 4.56 4.45  HGB 12.9 12.2 12.0  HCT 38.2 37.3 36.4  MCV 79.7* 81.8 81.8  MCH 26.9 26.8 27.0  MCHC 33.8 32.7 33.0  RDW 13.9 14.0 13.9  PLT 270 276 276   Thyroid No results for input(s): TSH, FREET4 in the last 168 hours.  BNPNo results for input(s): BNP, PROBNP in the last 168 hours.  DDimer No results for input(s): DDIMER in the last 168 hours.   Radiology  DG Chest 2 View  Result Date: 01/30/2021 CLINICAL DATA:  Chest pain. EXAM: CHEST - 2 VIEW COMPARISON:  11/30/2017 FINDINGS: The heart size and mediastinal contours are within normal limits. Both lungs are clear. The visualized skeletal structures are unremarkable. IMPRESSION: No active cardiopulmonary disease. Electronically Signed   By: Kerby Moors M.D.   On: 01/30/2021 10:10   ECHOCARDIOGRAM COMPLETE  Result Date: 01/31/2021    ECHOCARDIOGRAM REPORT   Patient Name:   Jacqueline Hodges Date of Exam: 01/31/2021 Medical Rec #:  [id number]      Height:       65.0 in Accession #:    [id number]     Weight:       212.7 lb Date of Birth:  1965/05/09      BSA:          2.031 m Patient Age:    55 years       BP:           114/73 mmHg Patient Gender: F              HR:           71 bpm. Exam Location:  Inpatient Procedure: 2D Echo, Color  Doppler and Cardiac Doppler Indications:    Acute MI i21.9  History:        Patient has prior history of Echocardiogram examinations, most                 recent 10/06/2018. CAD; Risk Factors:Hypertension. Prior                 performed at [place]:    [place] Referring Phys: [id number] [place]  1. Left ventricular ejection fraction, by estimation, is 65 to 70%. The left ventricle has normal function. The left ventricle has no regional wall motion abnormalities. There is moderate concentric left ventricular hypertrophy. Left ventricular diastolic parameters are indeterminate.  2. Right ventricular systolic function is normal. The right ventricular size is normal.  3. Right atrial size was mildly dilated.  4. The mitral valve is grossly normal. Trivial mitral valve regurgitation. No evidence of mitral stenosis.  5. The aortic valve has an indeterminant number of cusps. There is mild calcification of the aortic valve. Aortic valve regurgitation is not visualized. Mild aortic valve sclerosis is present, with no evidence of aortic valve stenosis.  6. The inferior vena cava is normal in size with greater than 50% respiratory variability, suggesting right atrial pressure of 3 mmHg. Comparison(s): Prior images unable to be directly viewed, comparison made by report only. Conclusion(s)/Recommendation(s): Otherwise normal echocardiogram, with minor abnormalities described in the report. FINDINGS  Left Ventricle: Left ventricular ejection fraction, by estimation, is 65 to 70%. The left ventricle has normal function. The left ventricle has no regional wall motion abnormalities. The left ventricular internal cavity size was normal in size. There is  moderate concentric left ventricular hypertrophy. Left ventricular diastolic parameters are indeterminate. Right Ventricle: The right ventricular size is normal. Right vetricular wall thickness was not well visualized. Right ventricular  systolic function is normal. Left Atrium: Left atrial size was normal in size. Right Atrium: Right atrial size was mildly dilated. Pericardium: There is no evidence of pericardial effusion. Mitral Valve: The mitral valve is grossly normal. Trivial mitral valve regurgitation. No evidence of mitral valve stenosis. Tricuspid Valve: The tricuspid valve is grossly normal. Tricuspid valve regurgitation is trivial. No evidence of tricuspid stenosis. Aortic Valve: The aortic valve  has an indeterminant number of cusps. There is mild calcification of the aortic valve. Aortic valve regurgitation is not visualized. Mild aortic valve sclerosis is present, with no evidence of aortic valve stenosis. Pulmonic Valve: The pulmonic valve was not well visualized. Pulmonic valve regurgitation is mild. No evidence of pulmonic stenosis. Aorta: The aortic root, ascending aorta, aortic arch and descending aorta are all structurally normal, with no evidence of dilitation or obstruction. Venous: The inferior vena cava is normal in size with greater than 50% respiratory variability, suggesting right atrial pressure of 3 mmHg. IAS/Shunts: The atrial septum is grossly normal.  LEFT VENTRICLE PLAX 2D LVIDd:         4.40 cm   Diastology LVIDs:         2.60 cm   LV e' medial:    6.31 cm/s LV PW:         1.40 cm   LV E/e' medial:  11.9 LV IVS:        1.38 cm   LV e' lateral:   5.33 cm/s LVOT diam:     1.90 cm   LV E/e' lateral: 14.1 LV SV:         43 LV SV Index:   21 LVOT Area:     2.84 cm  RIGHT VENTRICLE RV S prime:     6.09 cm/s TAPSE (M-mode): 1.9 cm LEFT ATRIUM           Index        RIGHT ATRIUM           Index LA diam:      3.00 cm 1.48 cm/m   RA Area:     17.50 cm LA Vol (A2C): 39.7 ml 19.55 ml/m  RA Volume:   47.80 ml  23.54 ml/m LA Vol (A4C): 36.2 ml 17.83 ml/m  AORTIC VALVE LVOT Vmax:   93.80 cm/s LVOT Vmean:  62.600 cm/s LVOT VTI:    0.153 m  AORTA Ao Root diam: 2.70 cm Ao Asc diam:  3.30 cm MITRAL VALVE MV Area (PHT): 2.97 cm     SHUNTS MV Decel Time: 255 msec    Systemic VTI:  0.15 m MV E velocity: 75.20 cm/s  Systemic Diam: 1.90 cm MV A velocity: 72.60 cm/s MV E/A ratio:  1.04 Buford Dresser MD Electronically signed by Buford Dresser MD Signature Date/Time: 01/31/2021/1:50:59 PM    Final     Cardiac Studies   2D echocardiogram 01/31/2021: IMPRESSIONS     1. Left ventricular ejection fraction, by estimation, is 65 to 70%. The  left ventricle has normal function. The left ventricle has no regional  wall motion abnormalities. There is moderate concentric left ventricular  hypertrophy. Left ventricular  diastolic parameters are indeterminate.   2. Right ventricular systolic function is normal. The right ventricular  size is normal.   3. Right atrial size was mildly dilated.   4. The mitral valve is grossly normal. Trivial mitral valve  regurgitation. No evidence of mitral stenosis.   5. The aortic valve has an indeterminant number of cusps. There is mild  calcification of the aortic valve. Aortic valve regurgitation is not  visualized. Mild aortic valve sclerosis is present, with no evidence of  aortic valve stenosis.   6. The inferior vena cava is normal in size with greater than 50%  respiratory variability, suggesting right atrial pressure of 3 mmHg.   Comparison(s): Prior images unable to be directly viewed, comparison made  by report only.   Patient  Profile     55 y.o. female with hypertension presents with non-ST elevation MI  Assessment & Plan    1.  Non-STEMI: Low-level troponin increase with peak troponin 301.  LVEF preserved at 65 to 70% with no regional wall motion abnormalities.  Patient with recurrent chest discomfort at rest overnight on IV heparin.  Plan for cardiac catheterization and possible PTCA/stenting today. I have reviewed the risks, indications, and alternatives to cardiac catheterization, possible angioplasty, and stenting with the patient. Risks include but are not  limited to bleeding, infection, vascular injury, stroke, myocardial infection, arrhythmia, kidney injury, radiation-related injury in the case of prolonged fluoroscopy use, emergency cardiac surgery, and death. The patient understands the risks of serious complication is 1-2 in 4239 with diagnostic cardiac cath and 1-2% or less with angioplasty/stenting.  Good right radial pulse should be candidate for radial artery catheterization.  Medical therapy includes aspirin, carvedilol, and IV heparin. 2.  Hypertension, uncontrolled: Patient currently on carvedilol and lisinopril.  She may respond better to a calcium channel blocker.  Add amlodipine 5 mg daily.  Continue other medications.  Blood pressure this morning markedly elevated at 181/107. 3.  Mixed hyperlipidemia: LDL cholesterol 139.  Will start on high intensity statin drug with atorvastatin 80 mg daily.  Disposition: Pending cardiac catheterization results today.  Add hemoglobin A1c to blood in lab.  Add amlodipine for better blood pressure control.  Otherwise continue current medical therapy.   For questions or updates, please contact [place] Please consult [email or web address] for contact info under        Signed, Sherren Mocha, MD  02/01/2021, 8:37 AM

## 2021-02-01 NOTE — Discharge Instructions (Addendum)
Medication Changes: - START Aspirin 81mg  daily.  - START Plavix 75mg  daily. - START Coreg 6.25mg  twice daily.  - START Atorvastatin (Lipitor) 80mg  daily. This is for your cholesterol. - START Metformin 500mg  twice daily. This is for your diabetes. - INCREASE Amlodipine to 10mg  daily. - STOP Ibuprofen. Recommend taking Tylenol as needed for pain instead.  Post NSTEMI: NO HEAVY LIFTING X 2 WEEKS. NO SEXUAL ACTIVITY X 2 WEEKS. NO DRIVING X 1 WEEK. NO SOAKING BATHS, HOT TUBS, POOLS, ETC., X 7 DAYS.  Radial Site Care: Refer to this sheet in the next few weeks. These instructions provide you with information on caring for yourself after your procedure. Your caregiver may also give you more specific instructions. Your treatment has been planned according to current medical practices, but problems sometimes occur. Call your caregiver if you have any problems or questions after your procedure. HOME CARE INSTRUCTIONS You may shower the day after the procedure. Remove the bandage (dressing) and gently wash the site with plain soap and water. Gently pat the site dry.  Do not apply powder or lotion to the site.  Do not submerge the affected site in water for 3 to 5 days.  Inspect the site at least twice daily.  Do not flex or bend the affected arm for 24 hours.  No lifting over 5 pounds (2.3 kg) for 5 days after your procedure.  Do not drive home if you are discharged the same day of the procedure. Have someone else drive you.  What to expect: Any bruising will usually fade within 1 to 2 weeks.  Blood that collects in the tissue (hematoma) may be painful to the touch. It should usually decrease in size and tenderness within 1 to 2 weeks.  SEEK IMMEDIATE MEDICAL CARE IF: You have unusual pain at the radial site.  You have redness, warmth, swelling, or pain at the radial site.  You have drainage (other than a small amount of blood on the dressing).  You have chills.  You have a fever or persistent  symptoms for more than 72 hours.  You have a fever and your symptoms suddenly get worse.  Your arm becomes pale, cool, tingly, or numb.  You have heavy bleeding from the site. Hold pressure on the site.

## 2021-02-01 NOTE — Interval H&P Note (Signed)
Cath Lab Visit (complete for each Cath Lab visit)  Clinical Evaluation Leading to the Procedure:   ACS: Yes.    Non-ACS:    Anginal Classification: CCS II  Anti-ischemic medical therapy: Minimal Therapy (1 class of medications)  Non-Invasive Test Results: No non-invasive testing performed  Prior CABG: No previous CABG      History and Physical Interval Note:  02/01/2021 4:00 PM  Jacqueline Hodges  has presented today for surgery, with the diagnosis of NSTEMI.  The various methods of treatment have been discussed with the patient and family. After consideration of risks, benefits and other options for treatment, the patient has consented to  Procedure(s): LEFT HEART CATH AND CORONARY ANGIOGRAPHY (N/A) as a surgical intervention.  The patient's history has been reviewed, patient examined, no change in status, stable for surgery.  I have reviewed the patient's chart and labs.  Questions were answered to the patient's satisfaction.     Quay Burow

## 2021-02-01 NOTE — Discharge Summary (Addendum)
Discharge Summary    Patient ID: Jacqueline Hodges MRN: [id number]; DOB: 1966-01-05  Admit date: 01/30/2021 Discharge date: 02/02/2021  PCP:  Pcp, No   [facility] Providers Cardiologist:  New  Discharge Diagnoses    Principal Problem:   NSTEMI (non-ST elevated myocardial infarction) [employer]) Active Problems:   Chest pain   CAD (coronary artery disease)   Hypertension   Hyperlipidemia   Type 2 diabetes mellitus with complication, without long-term current use of insulin ([place])   Diagnostic Studies/Procedures    Echocardiogram 01/31/2021: Impressions: 1. Left ventricular ejection fraction, by estimation, is 65 to 70%. The  left ventricle has normal function. The left ventricle has no regional  wall motion abnormalities. There is moderate concentric left ventricular  hypertrophy. Left ventricular  diastolic parameters are indeterminate.   2. Right ventricular systolic function is normal. The right ventricular  size is normal.   3. Right atrial size was mildly dilated.   4. The mitral valve is grossly normal. Trivial mitral valve  regurgitation. No evidence of mitral stenosis.   5. The aortic valve has an indeterminant number of cusps. There is mild  calcification of the aortic valve. Aortic valve regurgitation is not  visualized. Mild aortic valve sclerosis is present, with no evidence of  aortic valve stenosis.   6. The inferior vena cava is normal in size with greater than 50%  respiratory variability, suggesting right atrial pressure of 3 mmHg.   Comparison(s): Prior images unable to be directly viewed, comparison made by report only.   Conclusion(s)/Recommendation(s): Otherwise normal echocardiogram, with minor abnormalities described in the report. _____________  Left Cardia Catheterization 02/01/2021:   Dist LAD lesion is 70% stenosed.   Mid RCA lesion is 25% stenosed.   Impression: Jacqueline Hodges had a 70% apical LAD lesion of questionable significance.  She has  no other obstructive disease and normal LV function.  I believe this can be treated medically.  The sheath was removed and a TR band was placed on the right wrist to achieve patent hemostasis.  The patient left lab in stable condition.  Diagnostic Dominance: Right   History of Present Illness     Jacqueline Hodges is a 55 y.o. female with a history of hypertension, obesity, and a family history of early ASCVD who was admitted on 01/30/2021 with NSTEMI.  Patient reported substernal chest pain while cleaning her house 2 days prior to admission. It was non-radiating, sharp, and resolved with rest. Pain recurred on day of presentation and was more progressive. Therefore, she went to the [facility] ED.  In the ED, EKG showed no acute ischemic changes. High-sensitivity troponin was elevated at 296 >> 301. CBC and BMET unremarkable. She was started on IV Heparin and admitted to [facility] for further management.  Hospital Course     Consultants: Diabetes Coordinator  NSTEMI CAD Patient admitted with NSTEMI as stated above. High-sensitivity troponin peaked at 301. Echo showed LVEF of 65-70% with normal wall motion. Patient underwent cardiac catheterization on 02/01/2021 which showed 70% stenosis of distal LAD and 25% stenosis of mid RCA. Medical therapy was recommended. She tolerated the procedure well. Will start DAPT with Aspirin and Plavix. Will give 300mg  loading dose of Plavix prior to discharge and then she can start maintenance dose tomorrow. She was also started on a beta-blocker and high-intensity statin. Will have her ambulate with Cardiac Rehab prior to discharge.  Hypertension BP elevated throughout admission. She was started on Coreg 6.25mg  twice daily. Will increase Amlodipine to  10mg  daily. Continue home Lisinopril-HCTZ 20-25mg  daily. Recommended low sodium diet.  Hyperlipidemia Lipid panel this admission: Total Cholesterol 209, Triglycerides 115, HDL 47, LDL 139. LDL goal <70  given CAD. Patient was started on Lipitor 80mg  daily. Will need repeat lipid panel and LFTs in 6-8 weeks.  Type 2 Diabetes Mellitus  New diagnosis this admission. Hemoglobin A1c 7.8. Will start Metformin 500mg  twice daily and then have patient follow-up with PCP. Diet modification will be very important. Patient had multiple soda bottles and fast food bags in her room. Will have Diabetes Coordinator see her prior to discharge for additional education.  Patient was seen and examined by Dr. Angelena Form and determined to be stable for discharge. Outpatient follow-up has been arranged. Medications as below.  Did the patient have an acute coronary syndrome (MI, NSTEMI, STEMI, etc) this admission?:  Yes                               AHA/ACC Clinical Performance & Quality Measures: Aspirin prescribed? - Yes ADP Receptor Inhibitor (Plavix/Clopidogrel, Brilinta/Ticagrelor or Effient/Prasugrel) prescribed (includes medically managed patients)? - Yes Beta Blocker prescribed? - Yes High Intensity Statin (Lipitor 40-80mg  or Crestor 20-40mg ) prescribed? - Yes EF assessed during THIS hospitalization? - Yes For EF <40%, was ACEI/ARB prescribed? - Not Applicable (EF >/= 16%) For EF <40%, Aldosterone Antagonist (Spironolactone or Eplerenone) prescribed? - Not Applicable (EF >/= 10%) Cardiac Rehab Phase II ordered (including medically managed patients)? - Yes  _____________  Discharge Vitals Blood pressure (!) 160/94, pulse 69, temperature 99.2 F (37.3 C), temperature source Oral, resp. rate 18, height 5\' 5"  (1.651 m), weight 93.8 kg, last menstrual period 07/11/2017, SpO2 100 %.  Filed Weights   01/31/21 0500 02/01/21 0550 02/02/21 0441  Weight: 96.5 kg 93.8 kg 93.8 kg   General: 55 y.o. African-American female resting comfortably in no acute distress. HEENT: Normocephalic and atraumatic. Sclera clear.  Neck: Supple. No JVD. Heart: RRR. Distinct S1 and S2. No murmurs, gallops, or rubs. Radial and distal  pedal pulses 2+ and equal bilaterally. Right radial cath site soft with no signs of hematoma. Lungs: No increased work of breathing. Clear to ausculation bilaterally. No wheezes, rhonchi, or rales.  Abdomen: Soft, non-distended, and non-tender to palpation.  Extremities: No lower extremity edema.    Skin: Warm and dry. Neuro: Alert and oriented x3. No focal deficits. Psych: Normal affect. Responds appropriately.  Labs & Radiologic Studies    CBC Recent Labs    02/01/21 0121 02/02/21 0324  WBC 7.6 7.5  HGB 12.0 11.9*  HCT 36.4 36.3  MCV 81.8 82.5  PLT 276 960   Basic Metabolic Panel Recent Labs    02/01/21 0854 02/02/21 0324  NA 138 136  K 4.0 3.9  CL 105 106  CO2 26 23  GLUCOSE 146* 124*  BUN 11 11  CREATININE 1.03* 0.88  CALCIUM 9.0 9.1   Liver Function Tests No results for input(s): AST, ALT, ALKPHOS, BILITOT, PROT, ALBUMIN in the last 72 hours. No results for input(s): LIPASE, AMYLASE in the last 72 hours. High Sensitivity Troponin:   Recent Labs  Lab 01/30/21 0912 01/30/21 1059  TROPONINIHS 296* 301*    BNP Invalid input(s): POCBNP D-Dimer No results for input(s): DDIMER in the last 72 hours. Hemoglobin A1C Recent Labs    02/01/21 0854  HGBA1C 7.8*   Fasting Lipid Panel Recent Labs    01/31/21 0206  CHOL 209*  HDL  82  LDLCALC 139*  TRIG 115  CHOLHDL 4.4   Thyroid Function Tests No results for input(s): TSH, T4TOTAL, T3FREE, THYROIDAB in the last 72 hours.  Invalid input(s): FREET3 _____________  DG Chest 2 View  Result Date: 01/30/2021 CLINICAL DATA:  Chest pain. EXAM: CHEST - 2 VIEW COMPARISON:  11/30/2017 FINDINGS: The heart size and mediastinal contours are within normal limits. Both lungs are clear. The visualized skeletal structures are unremarkable. IMPRESSION: No active cardiopulmonary disease. Electronically Signed   By: Kerby Moors M.D.   On: 01/30/2021 10:10   CARDIAC CATHETERIZATION  Result Date: 02/01/2021 Images from  the original result were not included.   Dist LAD lesion is 70% stenosed.   Mid RCA lesion is 25% stenosed. Jacqueline Hodges is a 55 y.o. female  [id number] LOCATION:  FACILITY: [place] PHYSICIAN: Quay Burow, M.D. 1965-08-17 DATE OF PROCEDURE:  02/01/2021 DATE OF DISCHARGE: CARDIAC CATHETERIZATION History obtained from chart review and the patient.55 y.o. female with hypertension presents with non-ST elevation MI.  Her 2D echo revealed normal LV systolic function.  She is a history of hypertension hyperlipidemia. IMPRESSION:Jacqueline Hodges had a 70% apical LAD lesion of questionable significance.  She has no other obstructive disease and normal LV function.  I believe this can be treated medically.  The sheath was removed and a TR band was placed on the right wrist to achieve patent hemostasis.  The patient left lab in stable condition. Quay Burow. MD, [facility] 02/01/2021 4:36 PM    ECHOCARDIOGRAM COMPLETE  Result Date: 01/31/2021    ECHOCARDIOGRAM REPORT   Patient Name:   Jacqueline Hodges Date of Exam: 01/31/2021 Medical Rec #:  [id number]      Height:       65.0 in Accession #:    [id number]     Weight:       212.7 lb Date of Birth:  July 16, 1965      BSA:          2.031 m Patient Age:    37 years       BP:           114/73 mmHg Patient Gender: F              HR:           71 bpm. Exam Location:  Inpatient Procedure: 2D Echo, Color Doppler and Cardiac Doppler Indications:    Acute MI i21.9  History:        Patient has prior history of Echocardiogram examinations, most                 recent 10/06/2018. CAD; Risk Factors:Hypertension. Prior                 performed at [place]:    [place] Referring Phys: [id number] [place]  1. Left ventricular ejection fraction, by estimation, is 65 to 70%. The left ventricle has normal function. The left ventricle has no regional wall motion abnormalities. There is moderate concentric left ventricular hypertrophy. Left ventricular diastolic  parameters are indeterminate.  2. Right ventricular systolic function is normal. The right ventricular size is normal.  3. Right atrial size was mildly dilated.  4. The mitral valve is grossly normal. Trivial mitral valve regurgitation. No evidence of mitral stenosis.  5. The aortic valve has an indeterminant number of cusps. There is mild calcification of the aortic valve. Aortic valve regurgitation is not visualized. Mild aortic valve sclerosis is present, with no  evidence of aortic valve stenosis.  6. The inferior vena cava is normal in size with greater than 50% respiratory variability, suggesting right atrial pressure of 3 mmHg. Comparison(s): Prior images unable to be directly viewed, comparison made by report only. Conclusion(s)/Recommendation(s): Otherwise normal echocardiogram, with minor abnormalities described in the report. FINDINGS  Left Ventricle: Left ventricular ejection fraction, by estimation, is 65 to 70%. The left ventricle has normal function. The left ventricle has no regional wall motion abnormalities. The left ventricular internal cavity size was normal in size. There is  moderate concentric left ventricular hypertrophy. Left ventricular diastolic parameters are indeterminate. Right Ventricle: The right ventricular size is normal. Right vetricular wall thickness was not well visualized. Right ventricular systolic function is normal. Left Atrium: Left atrial size was normal in size. Right Atrium: Right atrial size was mildly dilated. Pericardium: There is no evidence of pericardial effusion. Mitral Valve: The mitral valve is grossly normal. Trivial mitral valve regurgitation. No evidence of mitral valve stenosis. Tricuspid Valve: The tricuspid valve is grossly normal. Tricuspid valve regurgitation is trivial. No evidence of tricuspid stenosis. Aortic Valve: The aortic valve has an indeterminant number of cusps. There is mild calcification of the aortic valve. Aortic valve regurgitation is not  visualized. Mild aortic valve sclerosis is present, with no evidence of aortic valve stenosis. Pulmonic Valve: The pulmonic valve was not well visualized. Pulmonic valve regurgitation is mild. No evidence of pulmonic stenosis. Aorta: The aortic root, ascending aorta, aortic arch and descending aorta are all structurally normal, with no evidence of dilitation or obstruction. Venous: The inferior vena cava is normal in size with greater than 50% respiratory variability, suggesting right atrial pressure of 3 mmHg. IAS/Shunts: The atrial septum is grossly normal.  LEFT VENTRICLE PLAX 2D LVIDd:         4.40 cm   Diastology LVIDs:         2.60 cm   LV e' medial:    6.31 cm/s LV PW:         1.40 cm   LV E/e' medial:  11.9 LV IVS:        1.38 cm   LV e' lateral:   5.33 cm/s LVOT diam:     1.90 cm   LV E/e' lateral: 14.1 LV SV:         43 LV SV Index:   21 LVOT Area:     2.84 cm  RIGHT VENTRICLE RV S prime:     6.09 cm/s TAPSE (M-mode): 1.9 cm LEFT ATRIUM           Index        RIGHT ATRIUM           Index LA diam:      3.00 cm 1.48 cm/m   RA Area:     17.50 cm LA Vol (A2C): 39.7 ml 19.55 ml/m  RA Volume:   47.80 ml  23.54 ml/m LA Vol (A4C): 36.2 ml 17.83 ml/m  AORTIC VALVE LVOT Vmax:   93.80 cm/s LVOT Vmean:  62.600 cm/s LVOT VTI:    0.153 m  AORTA Ao Root diam: 2.70 cm Ao Asc diam:  3.30 cm MITRAL VALVE MV Area (PHT): 2.97 cm    SHUNTS MV Decel Time: 255 msec    Systemic VTI:  0.15 m MV E velocity: 75.20 cm/s  Systemic Diam: 1.90 cm MV A velocity: 72.60 cm/s MV E/A ratio:  1.04 Buford Dresser MD Electronically signed by Buford Dresser MD Signature Date/Time: 01/31/2021/1:50:59 PM  Final    Disposition   Patient is being discharged home today in good condition.  Follow-up Plans & Appointments     Follow-up Information     Jacqueline Pitter, PA-C Follow up.   Specialties: Cardiology, Radiology Why: Hospital follow-up with Cardiology scheduled for 02/19/2021 at 2:45pm. Please arrive 15 minues  early. If this date/time does not work for you, please call our office to reschedule. Contact information: [address] [place] [address] [phone]         Primary Care Provider. Call.   Why: Please call to schedule a follow-up appointment with your primary care provider within the next 1-2 weeks.               Discharge Instructions     Diet - low sodium heart healthy   Complete by: As directed    Increase activity slowly   Complete by: As directed        Discharge Medications   Allergies as of 02/02/2021   No Known Allergies      Medication List     STOP taking these medications    ibuprofen 200 MG tablet Commonly known as: ADVIL       TAKE these medications    amLODipine 10 MG tablet Commonly known as: NORVASC Take 1/2 tablet (5 mg total) by mouth daily. What changed: medication strength   aspirin 81 MG EC tablet Take 1 tablet (81 mg total) by mouth daily. Swallow whole.   atorvastatin 80 MG tablet Commonly known as: LIPITOR Take 1 tablet (80 mg total) by mouth daily. Start taking on: February 03, 2021   carvedilol 6.25 MG tablet Commonly known as: COREG Take 1 tablet (6.25 mg total) by mouth 2 (two) times daily with a meal.   clopidogrel 75 MG tablet Commonly known as: PLAVIX Take 1 tablet (75 mg total) by mouth daily. Start taking on: February 03, 2021   diclofenac Sodium 1 % Gel Commonly known as: VOLTAREN Apply 2 g topically 4 (four) times daily as needed for pain.   lisinopril-hydrochlorothiazide 20-25 MG tablet Commonly known as: ZESTORETIC Take 1 tablet by mouth daily.   metFORMIN 500 MG tablet Commonly known as: GLUCOPHAGE Take 1 tablet (500 mg total) by mouth 2 (two) times daily with a meal. Start taking on: February 04, 2021   nitroGLYCERIN 0.4 MG SL tablet Commonly known as: NITROSTAT Place 1 tablet (0.4 mg total) under the tongue every 5 (five) minutes x 3 doses as needed for chest pain.            Outstanding Labs/Studies   Repeat lipid panel and LFTs in 6-8 weeks.   Duration of Discharge Encounter   Greater than 30 minutes including physician time.  Signed, Jacqueline Mclean, PA-C 02/02/2021, 9:55 AM  I have personally seen and examined this patient. I agree with the assessment and plan as outlined above.  Pt doing well today. No chest pain. Small distal LAD with moderately severe stenosis. Plan for medical management only. Will d/c home on ASA and Plavix.   Jacqueline Hodges 02/02/2021 1:42 PM

## 2021-02-01 NOTE — Progress Notes (Addendum)
Progress Note  Patient Name: Jacqueline Hodges Date of Encounter: 02/01/2021  [place] Cardiologist: None   Subjective   Mild chest discomfort overnight, not bad enough to notify nursing per patient.  However, same characteristic as pain that brought her into the hospital.  No chest pain or shortness of breath at present.  No complaints this morning.  Inpatient Medications    Scheduled Meds:  aspirin EC  81 mg Oral Daily   carvedilol  6.25 mg Oral BID WC   influenza vac split quadrivalent PF  0.5 mL Intramuscular Tomorrow-1000   lisinopril  10 mg Oral Daily   sodium chloride flush  3 mL Intravenous Q12H   Continuous Infusions:  sodium chloride     sodium chloride 1 mL/kg/hr (02/01/21 0622)   heparin 1,400 Units/hr (02/01/21 0728)   PRN Meds: sodium chloride, acetaminophen, docusate sodium, nitroGLYCERIN, ondansetron (ZOFRAN) IV, sodium chloride flush   Vital Signs    Vitals:   01/31/21 1655 01/31/21 2051 02/01/21 0518 02/01/21 0550  BP: (!) 162/89 (!) 176/88 (!) 181/107   Pulse: 73 74 80   Resp:  18 18   Temp:  99.4 F (37.4 C) 99.3 F (37.4 C)   TempSrc:  Oral Oral   SpO2:  99% 98%   Weight:    93.8 kg  Height:        Intake/Output Summary (Last 24 hours) at 02/01/2021 0837 Last data filed at 01/31/2021 1956 Gross per 24 hour  Intake 185.65 ml  Output --  Net 185.65 ml   Last 3 Weights 02/01/2021 01/31/2021 01/30/2021  Weight (lbs) 206 lb 14.4 oz 212 lb 11.9 oz 213 lb 3 oz  Weight (kg) 93.849 kg 96.5 kg 96.7 kg      Telemetry    Sinus rhythm without significant arrhythmia- Personally Reviewed  ECG    Normal sinus rhythm, possible age-indeterminate inferior infarct, possible age-indeterminate anterior infarct with poor R wave progression, nonspecific ST and T wave abnormality - Personally Reviewed  Physical Exam  Alert, oriented, no distress GEN: No acute distress.   Neck: No JVD Cardiac: RRR, no murmurs, rubs, or gallops.  Respiratory:  Clear to auscultation bilaterally. GI: Soft, nontender, non-distended  MS: No edema; No deformity.  2+ right radial pulse Neuro:  Nonfocal  Psych: Normal affect   Labs    High Sensitivity Troponin:   Recent Labs  Lab 01/30/21 0912 01/30/21 1059  TROPONINIHS 296* 301*     Chemistry Recent Labs  Lab 01/30/21 0912 01/31/21 0206  NA 138 135  K 3.8 3.5  CL 103 102  CO2 26 26  GLUCOSE 134* 155*  BUN 13 16  CREATININE 0.91 1.06*  CALCIUM 9.7 9.0  GFRNONAA >60 >60  ANIONGAP 9 7    Lipids  Recent Labs  Lab 01/31/21 0206  CHOL 209*  TRIG 115  HDL 47  LDLCALC 139*  CHOLHDL 4.4    Hematology Recent Labs  Lab 01/30/21 0912 01/31/21 0206 02/01/21 0121  WBC 8.6 7.3 7.6  RBC 4.79 4.56 4.45  HGB 12.9 12.2 12.0  HCT 38.2 37.3 36.4  MCV 79.7* 81.8 81.8  MCH 26.9 26.8 27.0  MCHC 33.8 32.7 33.0  RDW 13.9 14.0 13.9  PLT 270 276 276   Thyroid No results for input(s): TSH, FREET4 in the last 168 hours.  BNPNo results for input(s): BNP, PROBNP in the last 168 hours.  DDimer No results for input(s): DDIMER in the last 168 hours.   Radiology  DG Chest 2 View  Result Date: 01/30/2021 CLINICAL DATA:  Chest pain. EXAM: CHEST - 2 VIEW COMPARISON:  11/30/2017 FINDINGS: The heart size and mediastinal contours are within normal limits. Both lungs are clear. The visualized skeletal structures are unremarkable. IMPRESSION: No active cardiopulmonary disease. Electronically Signed   By: Kerby Moors M.D.   On: 01/30/2021 10:10   ECHOCARDIOGRAM COMPLETE  Result Date: 01/31/2021    ECHOCARDIOGRAM REPORT   Patient Name:   Jacqueline Hodges Date of Exam: 01/31/2021 Medical Rec #:  [id number]      Height:       65.0 in Accession #:    [id number]     Weight:       212.7 lb Date of Birth:  11/08/1965      BSA:          2.031 m Patient Age:    55 years       BP:           114/73 mmHg Patient Gender: F              HR:           71 bpm. Exam Location:  Inpatient Procedure: 2D Echo, Color  Doppler and Cardiac Doppler Indications:    Acute MI i21.9  History:        Patient has prior history of Echocardiogram examinations, most                 recent 10/06/2018. CAD; Risk Factors:Hypertension. Prior                 performed at [facility]:    [place] Referring Phys: [id number] [place]  1. Left ventricular ejection fraction, by estimation, is 65 to 70%. The left ventricle has normal function. The left ventricle has no regional wall motion abnormalities. There is moderate concentric left ventricular hypertrophy. Left ventricular diastolic parameters are indeterminate.  2. Right ventricular systolic function is normal. The right ventricular size is normal.  3. Right atrial size was mildly dilated.  4. The mitral valve is grossly normal. Trivial mitral valve regurgitation. No evidence of mitral stenosis.  5. The aortic valve has an indeterminant number of cusps. There is mild calcification of the aortic valve. Aortic valve regurgitation is not visualized. Mild aortic valve sclerosis is present, with no evidence of aortic valve stenosis.  6. The inferior vena cava is normal in size with greater than 50% respiratory variability, suggesting right atrial pressure of 3 mmHg. Comparison(s): Prior images unable to be directly viewed, comparison made by report only. Conclusion(s)/Recommendation(s): Otherwise normal echocardiogram, with minor abnormalities described in the report. FINDINGS  Left Ventricle: Left ventricular ejection fraction, by estimation, is 65 to 70%. The left ventricle has normal function. The left ventricle has no regional wall motion abnormalities. The left ventricular internal cavity size was normal in size. There is  moderate concentric left ventricular hypertrophy. Left ventricular diastolic parameters are indeterminate. Right Ventricle: The right ventricular size is normal. Right vetricular wall thickness was not well visualized. Right ventricular  systolic function is normal. Left Atrium: Left atrial size was normal in size. Right Atrium: Right atrial size was mildly dilated. Pericardium: There is no evidence of pericardial effusion. Mitral Valve: The mitral valve is grossly normal. Trivial mitral valve regurgitation. No evidence of mitral valve stenosis. Tricuspid Valve: The tricuspid valve is grossly normal. Tricuspid valve regurgitation is trivial. No evidence of tricuspid stenosis. Aortic Valve: The aortic valve  has an indeterminant number of cusps. There is mild calcification of the aortic valve. Aortic valve regurgitation is not visualized. Mild aortic valve sclerosis is present, with no evidence of aortic valve stenosis. Pulmonic Valve: The pulmonic valve was not well visualized. Pulmonic valve regurgitation is mild. No evidence of pulmonic stenosis. Aorta: The aortic root, ascending aorta, aortic arch and descending aorta are all structurally normal, with no evidence of dilitation or obstruction. Venous: The inferior vena cava is normal in size with greater than 50% respiratory variability, suggesting right atrial pressure of 3 mmHg. IAS/Shunts: The atrial septum is grossly normal.  LEFT VENTRICLE PLAX 2D LVIDd:         4.40 cm   Diastology LVIDs:         2.60 cm   LV e' medial:    6.31 cm/s LV PW:         1.40 cm   LV E/e' medial:  11.9 LV IVS:        1.38 cm   LV e' lateral:   5.33 cm/s LVOT diam:     1.90 cm   LV E/e' lateral: 14.1 LV SV:         43 LV SV Index:   21 LVOT Area:     2.84 cm  RIGHT VENTRICLE RV S prime:     6.09 cm/s TAPSE (M-mode): 1.9 cm LEFT ATRIUM           Index        RIGHT ATRIUM           Index LA diam:      3.00 cm 1.48 cm/m   RA Area:     17.50 cm LA Vol (A2C): 39.7 ml 19.55 ml/m  RA Volume:   47.80 ml  23.54 ml/m LA Vol (A4C): 36.2 ml 17.83 ml/m  AORTIC VALVE LVOT Vmax:   93.80 cm/s LVOT Vmean:  62.600 cm/s LVOT VTI:    0.153 m  AORTA Ao Root diam: 2.70 cm Ao Asc diam:  3.30 cm MITRAL VALVE MV Area (PHT): 2.97 cm     SHUNTS MV Decel Time: 255 msec    Systemic VTI:  0.15 m MV E velocity: 75.20 cm/s  Systemic Diam: 1.90 cm MV A velocity: 72.60 cm/s MV E/A ratio:  1.04 Buford Dresser MD Electronically signed by Buford Dresser MD Signature Date/Time: 01/31/2021/1:50:59 PM    Final     Cardiac Studies   2D echocardiogram 01/31/2021: IMPRESSIONS     1. Left ventricular ejection fraction, by estimation, is 65 to 70%. The  left ventricle has normal function. The left ventricle has no regional  wall motion abnormalities. There is moderate concentric left ventricular  hypertrophy. Left ventricular  diastolic parameters are indeterminate.   2. Right ventricular systolic function is normal. The right ventricular  size is normal.   3. Right atrial size was mildly dilated.   4. The mitral valve is grossly normal. Trivial mitral valve  regurgitation. No evidence of mitral stenosis.   5. The aortic valve has an indeterminant number of cusps. There is mild  calcification of the aortic valve. Aortic valve regurgitation is not  visualized. Mild aortic valve sclerosis is present, with no evidence of  aortic valve stenosis.   6. The inferior vena cava is normal in size with greater than 50%  respiratory variability, suggesting right atrial pressure of 3 mmHg.   Comparison(s): Prior images unable to be directly viewed, comparison made  by report only.   Patient  Profile     55 y.o. female with hypertension presents with non-ST elevation MI  Assessment & Plan    1.  Non-STEMI: Low-level troponin increase with peak troponin 301.  LVEF preserved at 65 to 70% with no regional wall motion abnormalities.  Patient with recurrent chest discomfort at rest overnight on IV heparin.  Plan for cardiac catheterization and possible PTCA/stenting today. I have reviewed the risks, indications, and alternatives to cardiac catheterization, possible angioplasty, and stenting with the patient. Risks include but are not  limited to bleeding, infection, vascular injury, stroke, myocardial infection, arrhythmia, kidney injury, radiation-related injury in the case of prolonged fluoroscopy use, emergency cardiac surgery, and death. The patient understands the risks of serious complication is 1-2 in 6384 with diagnostic cardiac cath and 1-2% or less with angioplasty/stenting.  Good right radial pulse should be candidate for radial artery catheterization.  Medical therapy includes aspirin, carvedilol, and IV heparin. 2.  Hypertension, uncontrolled: Patient currently on carvedilol and lisinopril.  She may respond better to a calcium channel blocker.  Add amlodipine 5 mg daily.  Continue other medications.  Blood pressure this morning markedly elevated at 181/107. 3.  Mixed hyperlipidemia: LDL cholesterol 139.  Will start on high intensity statin drug with atorvastatin 80 mg daily.  Disposition: Pending cardiac catheterization results today.  Add hemoglobin A1c to blood in lab.  Add amlodipine for better blood pressure control.  Otherwise continue current medical therapy.   For questions or updates, please contact [place] Please consult [email or web address] for contact info under        Signed, Sherren Mocha, MD  02/01/2021, 8:37 AM

## 2021-02-01 NOTE — Progress Notes (Signed)
ANTICOAGULATION CONSULT NOTE  Pharmacy Consult for Heparin Indication: chest pain/ACS  No Known Allergies  Patient Measurements: Height: 5\' 5"  (165.1 cm) Weight: 93.8 kg (206 lb 14.4 oz) IBW/kg (Calculated) : 57 Heparin Dosing Weight: 78.9 kg  Vital Signs: Temp: 99.3 F (37.4 C) (10/24 0518) Temp Source: Oral (10/24 0518) BP: 157/83 (10/24 1042) Pulse Rate: 72 (10/24 0845)  Labs: Recent Labs    01/30/21 0912 01/30/21 1059 01/30/21 1100 01/30/21 1651 01/31/21 0206 01/31/21 1028 01/31/21 1632 02/01/21 0121 02/01/21 0854  HGB 12.9  --   --   --  12.2  --   --  12.0  --   HCT 38.2  --   --   --  37.3  --   --  36.4  --   PLT 270  --   --   --  276  --   --  276  --   LABPROT  --   --  13.0  --   --   --   --   --   --   INR  --   --  1.0  --   --   --   --   --   --   HEPARINUNFRC  --   --   --    < > 0.17* 0.33 0.24* 0.34  --   CREATININE 0.91  --   --   --  1.06*  --   --   --  1.03*  TROPONINIHS 296* 301*  --   --   --   --   --   --   --    < > = values in this interval not displayed.     Estimated Creatinine Clearance: 69.9 mL/min (A) (by C-G formula based on SCr of 1.03 mg/dL (H)).   Medical History: Past Medical History:  Diagnosis Date   Abnormal perimenopausal bleeding 02/20/2017   Cervical radiculopathy 01/12/2017   Fibroid uterus 02/20/2017   Hypertension    Migraine with aura and without status migrainosus, not intractable 01/12/2017   Precordial chest pain     Medications:  Medications Prior to Admission  Medication Sig Dispense Refill Last Dose   amLODipine (NORVASC) 5 MG tablet Take 5 mg by mouth daily.   01/30/2021   diclofenac Sodium (VOLTAREN) 1 % GEL Apply 2 g topically 4 (four) times daily as needed for pain.   Past Week   ibuprofen (ADVIL) 200 MG tablet Take 600 mg by mouth every 6 (six) hours as needed for headache or mild pain.   Past Month   lisinopril-hydrochlorothiazide (ZESTORETIC) 20-25 MG tablet Take 1 tablet by mouth daily.    01/30/2021    Scheduled:   amLODipine  5 mg Oral Daily   aspirin EC  81 mg Oral Daily   atorvastatin  80 mg Oral Daily   carvedilol  6.25 mg Oral BID WC   influenza vac split quadrivalent PF  0.5 mL Intramuscular Tomorrow-1000   lisinopril  10 mg Oral Daily   sodium chloride flush  3 mL Intravenous Q12H   Infusions:   sodium chloride     sodium chloride 1 mL/kg/hr (02/01/21 1318)   heparin 1,400 Units/hr (02/01/21 0728)   PRN:   Assessment: 55 yo F with a history of HTN. Patient is presenting with chest pain x 2 days. Heparin per pharmacy consult placed for chest pain/ACS.  Patient was not on anticoagulation prior to arrival.  Heparin level now therapeutic at 0.34 on  1400 units/hr. CBC stable. No bleeding issues noted.   Goal of Therapy:  Heparin level 0.3-0.7 units/ml Monitor platelets by anticoagulation protocol: Yes   Plan:  Continue heparin at 1400 units/h Recheck heparin level with daily labs  Erin Hearing PharmD., BCPS Clinical Pharmacist 02/01/2021 3:37 PM

## 2021-02-02 ENCOUNTER — Encounter (HOSPITAL_COMMUNITY): Payer: Self-pay | Admitting: Cardiovascular Disease

## 2021-02-02 ENCOUNTER — Other Ambulatory Visit (HOSPITAL_COMMUNITY): Payer: Self-pay

## 2021-02-02 ENCOUNTER — Telehealth (HOSPITAL_COMMUNITY): Payer: Self-pay | Admitting: Pharmacist

## 2021-02-02 DIAGNOSIS — I214 Non-ST elevation (NSTEMI) myocardial infarction: Secondary | ICD-10-CM | POA: Diagnosis not present

## 2021-02-02 DIAGNOSIS — E785 Hyperlipidemia, unspecified: Secondary | ICD-10-CM

## 2021-02-02 DIAGNOSIS — I10 Essential (primary) hypertension: Secondary | ICD-10-CM

## 2021-02-02 DIAGNOSIS — I2510 Atherosclerotic heart disease of native coronary artery without angina pectoris: Secondary | ICD-10-CM

## 2021-02-02 DIAGNOSIS — E118 Type 2 diabetes mellitus with unspecified complications: Secondary | ICD-10-CM

## 2021-02-02 LAB — CBC
HCT: 36.3 % (ref 36.0–46.0)
Hemoglobin: 11.9 g/dL — ABNORMAL LOW (ref 12.0–15.0)
MCH: 27 pg (ref 26.0–34.0)
MCHC: 32.8 g/dL (ref 30.0–36.0)
MCV: 82.5 fL (ref 80.0–100.0)
Platelets: 231 10*3/uL (ref 150–400)
RBC: 4.4 MIL/uL (ref 3.87–5.11)
RDW: 14 % (ref 11.5–15.5)
WBC: 7.5 10*3/uL (ref 4.0–10.5)
nRBC: 0 % (ref 0.0–0.2)

## 2021-02-02 LAB — BASIC METABOLIC PANEL
Anion gap: 7 (ref 5–15)
BUN: 11 mg/dL (ref 6–20)
CO2: 23 mmol/L (ref 22–32)
Calcium: 9.1 mg/dL (ref 8.9–10.3)
Chloride: 106 mmol/L (ref 98–111)
Creatinine, Ser: 0.88 mg/dL (ref 0.44–1.00)
GFR, Estimated: >60 mL/min (ref >60)
Glucose, Bld: 124 mg/dL — ABNORMAL HIGH (ref 70–99)
Potassium: 3.9 mmol/L (ref 3.5–5.1)
Sodium: 136 mmol/L (ref 135–145)

## 2021-02-02 MED ORDER — ASPIRIN 81 MG PO TBEC
81.0000 mg | DELAYED_RELEASE_TABLET | Freq: Every day | ORAL | 11 refills | Status: DC
  Filled 2021-02-02: qty 30, 30d supply, fill #0

## 2021-02-02 MED ORDER — NITROGLYCERIN 0.4 MG SL SUBL
0.4000 mg | SUBLINGUAL_TABLET | SUBLINGUAL | 2 refills | Status: DC | PRN
  Filled 2021-02-02: qty 25, 7d supply, fill #0

## 2021-02-02 MED ORDER — CARVEDILOL 6.25 MG PO TABS
6.2500 mg | ORAL_TABLET | Freq: Two times a day (BID) | ORAL | 2 refills | Status: DC
  Filled 2021-02-02: qty 60, 30d supply, fill #0

## 2021-02-02 MED ORDER — INFLUENZA VAC SPLIT QUAD 0.5 ML IM SUSY
0.5000 mL | PREFILLED_SYRINGE | INTRAMUSCULAR | Status: AC
  Administered 2021-02-02: 0.5 mL via INTRAMUSCULAR
  Filled 2021-02-02: qty 0.5

## 2021-02-02 MED ORDER — CLOPIDOGREL BISULFATE 75 MG PO TABS: 75.0000 mg | ORAL_TABLET | Freq: Every day | ORAL | Status: DC

## 2021-02-02 MED ORDER — CLOPIDOGREL BISULFATE 75 MG PO TABS
75.0000 mg | ORAL_TABLET | Freq: Every day | ORAL | 11 refills | Status: DC
  Filled 2021-02-02: qty 30, 30d supply, fill #0

## 2021-02-02 MED ORDER — METFORMIN HCL 500 MG PO TABS: 500.0000 mg | ORAL_TABLET | Freq: Two times a day (BID) | ORAL | Status: DC

## 2021-02-02 MED ORDER — METFORMIN HCL 500 MG PO TABS
500.0000 mg | ORAL_TABLET | Freq: Two times a day (BID) | ORAL | 2 refills | Status: AC
  Filled 2021-02-02: qty 60, 30d supply, fill #0

## 2021-02-02 MED ORDER — LIVING WELL WITH DIABETES BOOK
Freq: Once | Status: DC
  Filled 2021-02-02: qty 1

## 2021-02-02 MED ORDER — LORATADINE 10 MG PO TABS
10.0000 mg | ORAL_TABLET | Freq: Every day | ORAL | Status: DC
  Administered 2021-02-02: 10 mg via ORAL
  Filled 2021-02-02: qty 1

## 2021-02-02 MED ORDER — ATORVASTATIN CALCIUM 80 MG PO TABS
80.0000 mg | ORAL_TABLET | Freq: Every day | ORAL | 5 refills | Status: DC
  Filled 2021-02-02: qty 30, 30d supply, fill #0

## 2021-02-02 MED ORDER — AMLODIPINE BESYLATE 10 MG PO TABS
10.0000 mg | ORAL_TABLET | Freq: Every day | ORAL | 2 refills | Status: DC
  Filled 2021-02-02: qty 30, 30d supply, fill #0

## 2021-02-02 MED ORDER — AMLODIPINE BESYLATE 10 MG PO TABS
5.0000 mg | ORAL_TABLET | Freq: Every day | ORAL | 2 refills | Status: DC
  Filled 2021-02-02: qty 15, 30d supply, fill #0

## 2021-02-02 MED ORDER — CLOPIDOGREL BISULFATE 75 MG PO TABS
300.0000 mg | ORAL_TABLET | Freq: Once | ORAL | Status: AC
  Administered 2021-02-02: 300 mg via ORAL
  Filled 2021-02-02: qty 4

## 2021-02-02 NOTE — TOC Benefit Eligibility Note (Signed)
Patient Teacher, English as a foreign language completed.    The patient is currently admitted and upon discharge could be taking Jardiance 10 mg.  The current 30 day co-pay is, $200.00 due to a $4,950.00 deductible remaining..   The patient is currently admitted and upon discharge could be taking Farxiga 10 mg.  The current 30 day co-pay is, $75.00,AstraZen[employer], the mfr of FARXIGA 10 MG TABLET paid 125.00 toward plan copa, due to a $4,950.00 deductible remaining..   The patient is insured through [employer], [place] Patient Advocate Specialist [place] Team Direct Number: [phone]  Fax: [phone]

## 2021-02-02 NOTE — Progress Notes (Signed)
CARDIAC REHAB PHASE I   PRE:  Rate/Rhythm: 70 by pulse ox    BP: sitting 131/77    SaO2: 97 RA  MODE:  Ambulation: 400 ft   POST:  Rate/Rhythm: 76 by pulse ox    BP: sitting 142/112     SaO2: 100 RA  Pt feeling well. Able to ambulate without CP although BP elevated after walk. Discussed MI, restrictions, diet, exercise, NTG, and CRPII. Pt very receptive. Will refer to [place].  [place], ACSM 02/02/2021 11:58 AM

## 2021-02-02 NOTE — Telephone Encounter (Signed)
Added to appt notes as FYI

## 2021-02-02 NOTE — Progress Notes (Signed)
Inpatient Diabetes Program Recommendations  AACE/ADA: New Consensus Statement on Inpatient Glycemic Control (2015)  Target Ranges:  Prepandial:   less than 140 mg/dL      Peak postprandial:   less than 180 mg/dL (1-2 hours)      Critically ill patients:  140 - 180 mg/dL   Lab Results  Component Value Date   HGBA1C 7.8 (H) 02/01/2021    Review of Glycemic Control Results for Jacqueline Hodges, Jacqueline Hodges (MRN [id number]) as of 02/02/2021 11:29  Ref. Range 02/01/2021 08:54 02/02/2021 03:24  Glucose Latest Ref Range: 70 - 99 mg/dL 146 (H) 124 (H)   Diabetes history: New onset  Outpatient Diabetes medications: none Current orders for Inpatient glycemic control: Metformin 500 mg BID  Inpatient Diabetes Program Recommendations:    Spoke with patient regarding new onset diabetes. Patient reports that she has been checking blood sugars hat home twice daily due to being told she had prediabetes.  Reviewed patient's current A1c of 7.8%. Explained what a A1c is and what it measures. Also reviewed goal A1c with patient, importance of good glucose control @ home, and blood sugar goals. Reviewed patho of DM, role of pancreas, signs and symptoms of hypo vs hyperglycemia, interventions, vascular changes and commorbidities.  Patient has a meter and supplies. Reviewed frequency of checking and when to call MD. Patient plans to follow up with PCP.  Admits to drinking regular sodas occasionally and body armour sports drinks. Education provided on nutritional labels, CHO alottment, alternatives, importance of being mindful of CHO intake and goal setting. Patient has taken education outpatient for DM previously. Has no further questions at this time.   Thanks, Bronson Curb, MSN, RNC-OB Diabetes Coordinator [phone] (8a-5p)

## 2021-02-02 NOTE — Telephone Encounter (Signed)
Hello,  The Pharmacy team is conducting a discharge transitions of care quality improvement initiative. The recommendations below are for your consideration.    Jacqueline Hodges is a 55 y.o. female (MRN: [id number], DOB: [birth year]) who was recently hospitalized on 01/30/2021 for chest pain. They are anticipated to visit your clinic for post-discharge follow-up and may benefit from assistance with medication initiation and/or access.     Relevant medication access issues which may benefit from further intervention include:n/a   Please consider the following therapy recommendations at follow-up appointment below:   1.NSTEMI - no stent due to distal small diameter vessel  -Added asa and plavix for at least 12 months -Added coreg 6.25 bid, follow up titration as able 2.HTN -Increased amlodipine to 10mg  -Instructed patient to watch for any s/s of lower extremity swelling -Follow up need for further titration of lisinopril/hctz vs consideration of bidil as outpt 3.HLD -added atorvastatin 80mg  -follow up repeating lipid panel for additional rx therapy   Other relevant medication issues from their recent admission include: 1.DM -Appears new dx, will have diabetes coordinator see prior to discharge -Added metformin 500 bid -Could consider Farxiga; copay $75 (high copay d/t unmet deductible) -Patient asked to follow up with PCP 2.Chronic ibuprofen use - Advised patient of medications to avoid (NSAIDs, ASA)  - Educated that Tylenol (acetaminophen) will be the preferred analgesic to prevent risk of bleeding    We appreciate your assistance with the implementation of these recommendations. Please let us know if there is anything we can help you with at this time.       Thanks!   Erin Hearing PharmD., BCPS Clinical Pharmacist 02/02/2021 9:31 AM

## 2021-02-10 ENCOUNTER — Other Ambulatory Visit (HOSPITAL_COMMUNITY): Payer: Self-pay

## 2021-02-11 ENCOUNTER — Other Ambulatory Visit (HOSPITAL_COMMUNITY): Payer: Self-pay

## 2021-02-11 ENCOUNTER — Telehealth (HOSPITAL_COMMUNITY): Payer: Self-pay | Admitting: Pharmacist

## 2021-02-12 ENCOUNTER — Telehealth (HOSPITAL_COMMUNITY): Payer: Self-pay | Admitting: Pharmacist

## 2021-02-12 ENCOUNTER — Other Ambulatory Visit (HOSPITAL_COMMUNITY): Payer: Self-pay

## 2021-02-12 NOTE — Telephone Encounter (Signed)
Transitions of Care Pharmacy   Call attempted for a pharmacy transitions of care follow-up. HIPAA appropriate voicemail was left with call back information provided.   Call attempt #2. Will follow-up in 2-3 days.    

## 2021-02-15 ENCOUNTER — Other Ambulatory Visit (HOSPITAL_COMMUNITY): Payer: Self-pay

## 2021-02-15 ENCOUNTER — Telehealth (HOSPITAL_COMMUNITY): Payer: Self-pay | Admitting: Pharmacist

## 2021-02-15 NOTE — Telephone Encounter (Signed)
Transitions of Care Pharmacy   Call attempted for a pharmacy transitions of care follow-up. HIPAA appropriate voicemail was left on home phone with call back information provided.   Call attempted to mobile number as well, no answer and VM full.   Call attempt #3. No further follow-up at this time.

## 2021-02-18 ENCOUNTER — Encounter: Payer: Self-pay | Admitting: Physician Assistant

## 2021-02-18 NOTE — Progress Notes (Signed)
Cardiology Office Note    Date:  02/19/2021   ID:  Jacqueline Hodges, DOB December 25, 1965, MRN [id number]  PCP:  Pcp, No  Cardiologist:  Dr. Acie Fredrickson, New Electrophysiologist:  None   Chief Complaint: post-hospital f/u for chest pain, CAD  History of Present Illness:   Jacqueline Hodges is a 55 y.o. female with history of recent NSTEMI/CAD, HTN, T2DM, HLD, migraines, obesity here for post-hospital follow-up s/p NSTEMI. She presented to the ED on 01/30/21 and was admitted for evaluation of  chest pain with peak troponin of 301. Cath revealed 70% dLAD, 25% mRCA but dLAD felt of uncertain significance, treated medically, 2D echo showed EF 65-70%, mod LVH, mild RAE, aortic sclerosis without stenosis. She was started on GDMT including DAPT, beta blocker, statin. She was additionally diagnosed with DM during hospitalization and started on metformin.   Today she presents for hospital follow-up accompanied by her fiance'. She reports that she continues to have symptoms of left side chest pain that generally worsens with exertion, however she was awakened from sleep with an episode 2 nights ago. Pain resolves after taking SL NTG and resting. Generally, these episodes take 5-10 minutes to resolve and she has not had an episode that resolved without ntg and rest. Pain is not reproducible and does not change with movement. These episodes occur several times per week and she has generally felt "drained and not myself." She denies syncope, palpitations, PND, orthopnea, edema. Has occasional dizziness, diaphoresis and nausea that accompany pain. Separately, she has had sporadic inconsistent right sided chest cramping that radiates into her right shoulder. These episodes resolve with deep breathing and occur separately from left sided chest pain. She can press on her chest at times and elicit soreness but this is not the chest pain she describes above.  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 She does not wish to go back to the hospital at this time. She has not had any chest pain today.   Labwork independently reviewed: 02/02/21 Na 136, K+ 3.9, gluc 124, creatinine 0.88, hgb 11.9, plt 231 02/01/21 A1C 7.8 01/31/21 LDL 139, HDL 47, Trigs 115  Past Medical History:  Diagnosis Date   Abnormal perimenopausal bleeding 02/20/2017   CAD in native artery    a. NSTEMI 02/2021 with 70% dLAD, 25% mRCA but dLAD felt of uncertain significance, treated medically.   Cervical radiculopathy 01/12/2017   Diabetes mellitus ([name])    Fibroid uterus 02/20/2017   Hyperlipidemia LDL goal <70    Hypertension    Migraine with aura and without status migrainosus, not intractable 01/12/2017   Precordial chest pain     Past Surgical History:  Procedure Laterality Date   CESAREAN SECTION     LEFT HEART CATH AND CORONARY ANGIOGRAPHY N/A 02/01/2021   Procedure: LEFT HEART CATH AND CORONARY ANGIOGRAPHY;  Surgeon: Lorretta Harp, MD;  Location: [facility];  Service: Cardiovascular;  Laterality: N/A;    Current Medications: Current Meds  Medication Sig   amLODipine (NORVASC) 10 MG tablet Take 1 tablet (10 mg total) by mouth daily.   aspirin 81 MG EC tablet Take 1 tablet (81 mg total) by mouth daily. Swallow whole.   atorvastatin (LIPITOR) 80 MG tablet Take 1 tablet (80 mg total) by mouth daily.   carvedilol (COREG) 6.25 MG tablet Take 1 tablet (6.25 mg total) by mouth 2 (two) times daily.   clopidogrel (PLAVIX) 75 MG  tablet Take 1 tablet (75 mg total) by mouth daily.   lisinopril-hydrochlorothiazide (ZESTORETIC) 20-25 MG tablet Take 1 tablet by mouth daily.   metFORMIN (GLUCOPHAGE) 500 MG tablet Take 1 tablet (500 mg total) by mouth 2 (two) times daily with a meal.   nitroGLYCERIN (NITROSTAT) 0.4 MG SL tablet Place 1 tablet (0.4 mg total) under the tongue every 5 (five) minutes x 3 doses as needed for chest pain.    Allergies:   Patient has no known  allergies.   Social History   Socioeconomic History   Marital status: Single    Spouse name: Not on file   Number of children: Not on file   Years of education: Not on file   Highest education level: Not on file  Occupational History   Not on file  Tobacco Use   Smoking status: Never   Smokeless tobacco: Never  Vaping Use   Vaping Use: Never used  Substance and Sexual Activity   Alcohol use: No   Drug use: No   Sexual activity: Yes    Birth control/protection: Surgical    Comment: BTL 1994  Other Topics Concern   Not on file  Social History Narrative   Not on file   Social Determinants of Health   Financial Resource Strain: Not on file  Food Insecurity: Not on file  Transportation Needs: Not on file  Physical Activity: Not on file  Stress: Not on file  Social Connections: Not on file     Family History:  The patient's family history includes CAD, Diabetes in her mother; Heart failure in her mother; High Cholesterol in her mother.  ROS:   Please see the history of present illness.  All other systems are reviewed and otherwise negative.    EKGs/Labs/Other Studies Reviewed:    Studies reviewed are outlined and summarized above. Reports included below if pertinent.  LHC 02/01/21  70% apical LAD lesion of questionable significance.  She has no other obstructive disease and normal LV function.  I believe this can be treated medically  ECHO 01/31/21  1. Left ventricular ejection fraction, by estimation, is 65 to 70%. The  left ventricle has normal function. The left ventricle has no regional  wall motion abnormalities. There is moderate concentric left ventricular  hypertrophy. Left ventricular  diastolic parameters are indeterminate.   2. Right ventricular systolic function is normal. The right ventricular  size is normal.   3. Right atrial size was mildly dilated.   4. The mitral valve is grossly normal. Trivial mitral valve  regurgitation. No evidence of  mitral stenosis.   5. The aortic valve has an indeterminant number of cusps. There is mild  calcification of the aortic valve. Aortic valve regurgitation is not  visualized. Mild aortic valve sclerosis is present, with no evidence of  aortic valve stenosis.   6. The inferior vena cava is normal in size with greater than 50%  respiratory variability, suggesting right atrial pressure of 3 mmHg      EKG:  EKG is ordered today, personally reviewed, demonstrating SR at rate 76 bpm with q waves and inverted t waves in leads III and aVF; no significant change from previous  Recent Labs: 02/02/2021: BUN 11; Creatinine, Ser 0.88; Hemoglobin 11.9; Platelets 231; Potassium 3.9; Sodium 136  Recent Lipid Panel    Component Value Date/Time   CHOL 209 (H) 01/31/2021 0206   TRIG 115 01/31/2021 0206   HDL 47 01/31/2021 0206   CHOLHDL 4.4 01/31/2021 0206  VLDL 23 01/31/2021 0206   LDLCALC 139 (H) 01/31/2021 0206    PHYSICAL EXAM:    VS:  BP (!) 142/96   Pulse 78   Ht 5' 4.5" (1.638 m)   Wt 197 lb (89.4 kg)   LMP 07/11/2017   SpO2 97%   BMI 33.29 kg/m   BMI: Body mass index is 33.29 kg/m.  GEN: Well nourished, well developed female in no acute distress HEENT: normocephalic, atraumatic Neck: no JVD, carotid bruits, or masses Cardiac: RRR; no murmurs, rubs, or gallops, no edema  Respiratory:  clear to auscultation bilaterally, normal work of breathing GI: soft, nontender, nondistended, + BS MS: no deformity or atrophy Skin: warm and dry, no rash, right radial cath site without hematoma or ecchymosis Neuro:  Alert and Oriented x 3, Strength and sensation are intact, follows commands Psych: euthymic mood, full affect  Wt Readings from Last 3 Encounters:  02/19/21 197 lb (89.4 kg)  02/02/21 206 lb 11.2 oz (93.8 kg)  06/11/20 200 lb 11.2 oz (91 kg)     ASSESSMENT & PLAN:   CAD with unstable angina: Patient continues to have symptoms of angina and dyspnea that are not much improved  since hospital d/c. BP remains poorly controlled. Discussed with Dr. Acie Fredrickson, supervising MD, who reviewed cath films and advised that location of 70% stenosis in dLAD likely not amendable to stent. He advised increased medical therapy and close follow-up. Will increase beta blocker and add Imdur. Gave strict ER precautions for worsening angina as she may require relook cath if symptoms do not resolve. Will have her return to see Dr. Acie Fredrickson 7-10 days. (She was seen by both Dr. Gwenlyn Found and Dr. Angelena Form in the hospital recently but neither have availability during needed timeframe.) Continue aspirin, statin, Plavix. Also checking TSH today due to chest pain and fatigue. Check CBC to ensure no new anemia.  HTN: BP elevated today and was elevated at hospital d/c allowing [place] room to increase medical therapy for angina. She does not monitor BP at home, was encouraged to get a home monitor. Increasing carvedilol and adding Imdur 30 mg for angina. Continue amlodipine lisinopril/hctz. Will continue to monitor. As above, check TSH.  HLD: On high intensity statin, tolerating well. Will check lipids in a few weeks. Will check LFTs today since no recent results on file.   4. T2DM: A1C 02/01/21 7.8. Started on metformin 2 weeks ago, tolerating well. Have encouraged her to find PCP. Will re-evaluate at future ov.     Cardiac Rehabilitation Eligibility Assessment  The patient is NOT ready to start cardiac rehabilitation due to: The patient is experiencing unstable angina.    Disposition: F/u with Dr. Acie Fredrickson in 7-10 days. Go to the ER with worsening symptoms.    Medication Adjustments/Labs and Tests Ordered: Current medicines are reviewed at length with the patient today.  Concerns regarding medicines are outlined above. Medication changes, Labs and Tests ordered today are summarized above and listed in the Patient Instructions accessible in Encounters.   Signed, Emmaline Life, NP  02/19/2021 5:11 PM     [address], [place], [place]  [address] Phone: [phone]; Fax: [phone]

## 2021-02-19 ENCOUNTER — Ambulatory Visit (INDEPENDENT_AMBULATORY_CARE_PROVIDER_SITE_OTHER): Payer: 59 | Admitting: Nurse Practitioner

## 2021-02-19 ENCOUNTER — Encounter: Payer: Self-pay | Admitting: Nurse Practitioner

## 2021-02-19 ENCOUNTER — Encounter: Payer: Self-pay | Admitting: Physician Assistant

## 2021-02-19 ENCOUNTER — Other Ambulatory Visit: Payer: Self-pay

## 2021-02-19 VITALS — BP 142/96 | HR 78 | Ht 64.5 in | Wt 197.0 lb

## 2021-02-19 DIAGNOSIS — I214 Non-ST elevation (NSTEMI) myocardial infarction: Secondary | ICD-10-CM | POA: Diagnosis not present

## 2021-02-19 DIAGNOSIS — R079 Chest pain, unspecified: Secondary | ICD-10-CM | POA: Diagnosis not present

## 2021-02-19 MED ORDER — ISOSORBIDE MONONITRATE ER 30 MG PO TB24: 30.0000 mg | ORAL_TABLET | Freq: Every day | ORAL | 3 refills | Status: DC

## 2021-02-19 MED ORDER — CARVEDILOL 12.5 MG PO TABS: 12.5000 mg | ORAL_TABLET | Freq: Two times a day (BID) | ORAL | 3 refills | Status: DC

## 2021-02-19 NOTE — Patient Instructions (Signed)
Medication Instructions:  Your physician has recommended you make the following change in your medication:   START Imdur (Isosorbide) 30 mg taking 1 daily  INCREASE the Carvedilol to 12.5 mg taking 1 twice a day.  You may take 2 of the 6.25 mg tablets twice a day to use them up    *If you need a refill on your cardiac medications before your next appointment, please call your pharmacy*   Lab Work: TODAY:  CMET, CBC, & TSH  If you have labs (blood work) drawn today and your tests are completely normal, you will receive your results only by: [place] (if you have MyChart) OR A paper copy in the mail If you have any lab test that is abnormal or we need to change your treatment, we will call you to review the results.   Testing/Procedures: None ordered   Follow-Up: At [facility], you and your health needs are our priority.  As part of our continuing mission to provide you with exceptional heart care, we have created designated Provider Care Teams.  These Care Teams include your primary Cardiologist (physician) and Advanced Practice Providers (APPs -  Physician Assistants and Nurse Practitioners) who all work together to provide you with the care you need, when you need it.  We recommend signing up for the patient portal called "MyChart".  Sign up information is provided on this After Visit Summary.  MyChart is used to connect with patients for Virtual Visits (Telemedicine).  Patients are able to view lab/test results, encounter notes, upcoming appointments, etc.  Non-urgent messages can be sent to your provider as well.   To learn more about what you can do with MyChart, go to [email or web address].    Your next appointment:   03/01/2021 ARRIVE AT 11:25   The format for your next appointment:   In Person  Provider:   Mertie Moores, MD     Other Instructions

## 2021-02-20 LAB — CBC
Hematocrit: 35.4 % (ref 34.0–46.6)
Hemoglobin: 11.8 g/dL (ref 11.1–15.9)
MCH: 26.9 pg (ref 26.6–33.0)
MCHC: 33.3 g/dL (ref 31.5–35.7)
MCV: 81 fL (ref 79–97)
Platelets: 379 10*3/uL (ref 150–450)
RBC: 4.38 x10E6/uL (ref 3.77–5.28)
RDW: 13.7 % (ref 11.7–15.4)
WBC: 7.3 10*3/uL (ref 3.4–10.8)

## 2021-02-20 LAB — COMPREHENSIVE METABOLIC PANEL
ALT: 27 IU/L (ref 0–32)
AST: 18 IU/L (ref 0–40)
Albumin/Globulin Ratio: 0.9 — ABNORMAL LOW (ref 1.2–2.2)
Albumin: 3.7 g/dL — ABNORMAL LOW (ref 3.8–4.9)
Alkaline Phosphatase: 71 IU/L (ref 44–121)
BUN/Creatinine Ratio: 11 (ref 9–23)
BUN: 9 mg/dL (ref 6–24)
Bilirubin Total: 0.5 mg/dL (ref 0.0–1.2)
CO2: 23 mmol/L (ref 20–29)
Calcium: 9.7 mg/dL (ref 8.7–10.2)
Chloride: 105 mmol/L (ref 96–106)
Creatinine, Ser: 0.79 mg/dL (ref 0.57–1.00)
Globulin, Total: 4 g/dL (ref 1.5–4.5)
Glucose: 104 mg/dL — ABNORMAL HIGH (ref 70–99)
Potassium: 4 mmol/L (ref 3.5–5.2)
Sodium: 141 mmol/L (ref 134–144)
Total Protein: 7.7 g/dL (ref 6.0–8.5)
eGFR: 88 mL/min/{1.73_m2} (ref >59)

## 2021-02-20 LAB — TSH: TSH: 0.424 u[IU]/mL — ABNORMAL LOW (ref 0.450–4.500)

## 2021-02-22 ENCOUNTER — Other Ambulatory Visit: Payer: Self-pay | Admitting: Nurse Practitioner

## 2021-02-22 DIAGNOSIS — R7989 Other specified abnormal findings of blood chemistry: Secondary | ICD-10-CM

## 2021-02-25 LAB — SPECIMEN STATUS REPORT

## 2021-02-25 LAB — T3: T3, Total: 116 ng/dL (ref 71–180)

## 2021-02-25 LAB — T4, FREE: Free T4: 1.26 ng/dL (ref 0.82–1.77)

## 2021-03-01 ENCOUNTER — Ambulatory Visit (INDEPENDENT_AMBULATORY_CARE_PROVIDER_SITE_OTHER): Payer: 59 | Admitting: Cardiovascular Disease

## 2021-03-01 ENCOUNTER — Encounter: Payer: Self-pay | Admitting: *Deleted

## 2021-03-01 ENCOUNTER — Other Ambulatory Visit: Payer: Self-pay

## 2021-03-01 ENCOUNTER — Encounter: Payer: Self-pay | Admitting: Cardiovascular Disease

## 2021-03-01 VITALS — BP 140/70 | HR 69 | Ht 64.5 in | Wt 199.6 lb

## 2021-03-01 DIAGNOSIS — M25562 Pain in left knee: Secondary | ICD-10-CM | POA: Diagnosis not present

## 2021-03-01 DIAGNOSIS — M25561 Pain in right knee: Secondary | ICD-10-CM | POA: Diagnosis not present

## 2021-03-01 NOTE — Progress Notes (Signed)
In her distal Cardiology Office Note:    Date:  03/01/2021   ID:  Jacqueline Hodges, DOB February 15, 1966, MRN [id number]  PCP:  Pcp, No   [facility] Providers Cardiologist:  Mertie Moores, MD { Cardiology APP:   Christen Bame, NP     Referring MD: No ref. provider found   Chief Complaint  Patient presents with   unstable angina    Coronary Artery Disease           Nov. 21, 2022   Jacqueline Hodges is a 55 y.o. female with a hx of  CAD .   She has a moderate stenosis in the distal LAD .  Given the small size of the vessel it was decided to treat her medically.  She was seen several weeks ago by Jacqueline Kroner, NP.  She was having some episodes of chest pain.  Her carvedilol was increased and isosorbide was added to her med list.  She is seen back today for further evaluation.  She is feeling better.  Had 1 episode of CP  Has had more itching , all over body, Also has swollen lymph nodes in her posterior cervical neck .  Denies any additional exposures.    She thinks that the increased meds are helping with the angina .  May not be as mentally fast as she used to be . Is still out of work      Past Medical History:  Diagnosis Date   Abnormal perimenopausal bleeding 02/20/2017   CAD in native artery    a. NSTEMI 02/2021 with 70% dLAD, 25% mRCA but dLAD felt of uncertain significance, treated medically.   Cervical radiculopathy 01/12/2017   Diabetes mellitus ([place])    Fibroid uterus 02/20/2017   Hyperlipidemia LDL goal <70    Hypertension    Migraine with aura and without status migrainosus, not intractable 01/12/2017   Precordial chest pain     Past Surgical History:  Procedure Laterality Date   CESAREAN SECTION     LEFT HEART CATH AND CORONARY ANGIOGRAPHY N/A 02/01/2021   Procedure: LEFT HEART CATH AND CORONARY ANGIOGRAPHY;  Surgeon: Lorretta Harp, MD;  Location: [facility];  Service: Cardiovascular;  Laterality: N/A;    Current  Medications: Current Meds  Medication Sig   amLODipine (NORVASC) 10 MG tablet Take 1 tablet (10 mg total) by mouth daily.   aspirin 81 MG EC tablet Take 1 tablet (81 mg total) by mouth daily. Swallow whole.   atorvastatin (LIPITOR) 80 MG tablet Take 1 tablet (80 mg total) by mouth daily.   carvedilol (COREG) 12.5 MG tablet Take 1 tablet (12.5 mg total) by mouth 2 (two) times daily.   clopidogrel (PLAVIX) 75 MG tablet Take 1 tablet (75 mg total) by mouth daily.   diclofenac Sodium (VOLTAREN) 1 % GEL SMARTSIG:2 Gram(s) Topical 4 Times Daily PRN   lisinopril-hydrochlorothiazide (ZESTORETIC) 20-25 MG tablet Take 1 tablet by mouth daily.   metFORMIN (GLUCOPHAGE) 500 MG tablet Take 1 tablet (500 mg total) by mouth 2 (two) times daily with a meal.   nitroGLYCERIN (NITROSTAT) 0.4 MG SL tablet Place 1 tablet (0.4 mg total) under the tongue every 5 (five) minutes x 3 doses as needed for chest pain.   [DISCONTINUED] isosorbide mononitrate (IMDUR) 30 MG 24 hr tablet Take 1 tablet (30 mg total) by mouth daily.     Allergies:   Patient has no known allergies.   Social History   Socioeconomic History   Marital status: Single  Spouse name: Not on file   Number of children: Not on file   Years of education: Not on file   Highest education level: Not on file  Occupational History   Not on file  Tobacco Use   Smoking status: Never   Smokeless tobacco: Never  Vaping Use   Vaping Use: Never used  Substance and Sexual Activity   Alcohol use: No   Drug use: No   Sexual activity: Yes    Birth control/protection: Surgical    Comment: BTL 1994  Other Topics Concern   Not on file  Social History Narrative   Not on file   Social Determinants of Health   Financial Resource Strain: Not on file  Food Insecurity: Not on file  Transportation Needs: Not on file  Physical Activity: Not on file  Stress: Not on file  Social Connections: Not on file     Family History: The patient's family history  includes Coronary artery disease in her mother; Diabetes in her mother; Heart failure in her mother; High Cholesterol in her mother.  ROS:   Please see the history of present illness.     All other systems reviewed and are negative.  EKGs/Labs/Other Studies Reviewed:    The following studies were reviewed today:   EKG:     Recent Labs: 02/19/2021: ALT 27; BUN 9; Creatinine, Ser 0.79; Hemoglobin 11.8; Platelets 379; Potassium 4.0; Sodium 141; TSH 0.424  Recent Lipid Panel    Component Value Date/Time   CHOL 209 (H) 01/31/2021 0206   TRIG 115 01/31/2021 0206   HDL 47 01/31/2021 0206   CHOLHDL 4.4 01/31/2021 0206   VLDL 23 01/31/2021 0206   LDLCALC 139 (H) 01/31/2021 0206     Risk Assessment/Calculations:          Physical Exam:    VS:  BP 140/70 (BP Location: Right Arm, Patient Position: Sitting, Cuff Size: Large)   Pulse 69   Ht 5' 4.5" (1.638 m)   Wt 199 lb 9.6 oz (90.5 kg)   LMP 07/11/2017   SpO2 99%   BMI 33.73 kg/m     Wt Readings from Last 3 Encounters:  03/01/21 199 lb 9.6 oz (90.5 kg)  02/19/21 197 lb (89.4 kg)  02/02/21 206 lb 11.2 oz (93.8 kg)     GEN: moderately obese female,  NAD  HEENT: Normal NECK: No JVD; No carotid bruits LYMPHATICS: No lymphadenopathy CARDIAC: RRR, no murmurs, rubs, gallops RESPIRATORY:  Clear to auscultation without rales, wheezing or rhonchi  ABDOMEN: Soft, non-tender, non-distended MUSCULOSKELETAL:  No edema; No deformity  SKIN: Warm and dry NEUROLOGIC:  Alert and oriented x 3 PSYCHIATRIC:  Normal affect   ASSESSMENT:    1. Pain in both knees, unspecified chronicity    PLAN:    In order of problems listed above:  CAD :  has a moderate stenosis left anterior descending artery.  She thinks that her symptoms are a bit better on the higher dose of carvedilol and isosorbide.  Unfortunately, she has developed an itchy rash and has some enlarged lymph nodes in her posterior cervical lymph node region.  It is unlikely  that this was caused by the isosorbide but it is possible.  I have asked her to stop the isosorbide for now to see if this rash and the swollen lymph nodes resolved.  She still eating a fair amount of salt and her blood pressure is mildly elevated.  I have encouraged her to stay away from eating any  extra salt in effort to improve her blood pressure.  Have her see an APP in the next 2 to 3 weeks for follow-up of her blood pressure.   Given her a return to work note for March 15, 2021.  I advised her that I do not do any long-term disability determinations or letters.    Cardiac Rehabilitation Eligibility Assessment  The patient is NOT ready to start cardiac rehabilitation due to: The patient is experiencing unstable angina.          Medication Adjustments/Labs and Tests Ordered: Current medicines are reviewed at length with the patient today.  Concerns regarding medicines are outlined above.  Orders Placed This Encounter  Procedures   Ambulatory referral to Orthopedic Surgery    No orders of the defined types were placed in this encounter.   Patient Instructions  Medication Instructions:  1) DISCONTINUE Imdur  *If you need a refill on your cardiac medications before your next appointment, please call your pharmacy*   Lab Work: None If you have labs (blood work) drawn today and your tests are completely normal, you will receive your results only by: [place] (if you have MyChart) OR A paper copy in the mail If you have any lab test that is abnormal or we need to change your treatment, we will call you to review the results.   Testing/Procedures: None   Follow-Up: At [facility] you and your health needs are our priority.  As part of our continuing mission to provide you with exceptional heart care, we have created designated Provider Care Teams.  These Care Teams include your primary Cardiologist (physician) and Advanced Practice Providers (APPs -   Physician Assistants and Nurse Practitioners) who all work together to provide you with the care you need, when you need it.  We recommend signing up for the patient portal called "MyChart".  Sign up information is provided on this After Visit Summary.  MyChart is used to connect with patients for Virtual Visits (Telemedicine).  Patients are able to view lab/test results, encounter notes, upcoming appointments, etc.  Non-urgent messages can be sent to your provider as well.   To learn more about what you can do with MyChart, go to [email or web address].    Your next appointment:   2-3 week(s)  The format for your next appointment:   In Person  Provider:   Christen Bame, NP   :1}    Other Instructions  You have been referred to a local Orthopedic office.  They will contact you to get you scheduled.    Signed, Mertie Moores, MD  03/01/2021 1:47 PM    [facility]

## 2021-03-01 NOTE — Patient Instructions (Signed)
Medication Instructions:  1) DISCONTINUE Imdur  *If you need a refill on your cardiac medications before your next appointment, please call your pharmacy*   Lab Work: None If you have labs (blood work) drawn today and your tests are completely normal, you will receive your results only by: [place] (if you have MyChart) OR A paper copy in the mail If you have any lab test that is abnormal or we need to change your treatment, we will call you to review the results.   Testing/Procedures: None   Follow-Up: At [facility], you and your health needs are our priority.  As part of our continuing mission to provide you with exceptional heart care, we have created designated Provider Care Teams.  These Care Teams include your primary Cardiologist (physician) and Advanced Practice Providers (APPs -  Physician Assistants and Nurse Practitioners) who all work together to provide you with the care you need, when you need it.  We recommend signing up for the patient portal called "MyChart".  Sign up information is provided on this After Visit Summary.  MyChart is used to connect with patients for Virtual Visits (Telemedicine).  Patients are able to view lab/test results, encounter notes, upcoming appointments, etc.  Non-urgent messages can be sent to your provider as well.   To learn more about what you can do with MyChart, go to [email or web address].    Your next appointment:   2-3 week(s)  The format for your next appointment:   In Person  Provider:   Christen Bame, NP   :1}    Other Instructions  You have been referred to a local Orthopedic office.  They will contact you to get you scheduled.

## 2021-03-15 ENCOUNTER — Institutional Professional Consult (permissible substitution): Payer: 59 | Admitting: Cardiovascular Disease

## 2021-03-22 NOTE — Progress Notes (Signed)
Cardiology Office Note:    Date:  03/23/2021   ID:  Jacqueline Hodges, DOB [birth year], MRN [id number]  PCP:  Pcp, No   [facility] Providers Cardiologist:  Mertie Moores, MD     Referring MD: No ref. provider found   Chief Complaint: follow-up of chest pain  History of Present Illness:    Jacqueline Hodges is a 54 y.o. female with a hx of chest pain, NSTEMI, CAD with moderate stenosis in distal LAD, DM, hyperlipidemia, migraine, and HTN.   She established care with our group during hospitalization in late October 2022. She presented to the [facility] ED on 01/30/21 with complaints of mid chest pain that was sharp and aching and occurring intermittently for the past 2 days.  She had a peak troponin of 301.  Left heart cath revealed 70% stenosis distal LAD, 25% stenosis mid RCA which was treated medically.  2D echo showed EF of 65 to 70%, moderate LVH, mild RAE, aortic sclerosis without stenosis.  She was last seen on 03/01/21 by Dr. Acie Fredrickson. At that visit, Imdur was discontinued. She reported itchy rash and some enlarged posterior cervical lymph nodes that she felt started around the time that Imdur was increased.   Today she is here with her husband and her granddaughter. She reports she had an episode of left arm pain and tingling that occurred yesterday morning while she was driving her grandson to school. She pulled over and took a nitroglycerin and the pain resolved. She went home and rested the remainder of the day and reports she feels better today. Has intermittent left arm numbness and tingling that occurs at various times consistent with pain she has had since her admission in October 2022. Feels symptoms may be exacerbated by the type of work she does. She also has intermittent neck pain and cramping. She has stopped Imdur in the setting of a rash and posterior cervical lymph node swelling.  The rash covers some of her torso, upper extremities and lower extremities. She has also noted swelling  under her arm. She thinks she has had some improvement of rash off Imdur but symptoms have not completely resolved. The left arm discomfort is about the same since stopping Imdur. She reports occasional dizziness at work. She is taking her cardiac medications before work and is not eating breakfast until several hours later. She works in Ambulance person. Her husband asks if it is safe for her to continue to work.   Past Medical History:  Diagnosis Date   Abnormal perimenopausal bleeding 02/20/2017   CAD in native artery    a. NSTEMI 02/2021 with 70% dLAD, 25% mRCA but dLAD felt of uncertain significance, treated medically.   Cervical radiculopathy 01/12/2017   Diabetes mellitus ([place])    Fibroid uterus 02/20/2017   Hyperlipidemia LDL goal <70    Hypertension    Migraine with aura and without status migrainosus, not intractable 01/12/2017   Precordial chest pain     Past Surgical History:  Procedure Laterality Date   CESAREAN SECTION     LEFT HEART CATH AND CORONARY ANGIOGRAPHY N/A 02/01/2021   Procedure: LEFT HEART CATH AND CORONARY ANGIOGRAPHY;  Surgeon: Lorretta Harp, MD;  Location: [facility];  Service: Cardiovascular;  Laterality: N/A;    Current Medications: Current Meds  Medication Sig   amLODipine (NORVASC) 10 MG tablet Take 1 tablet (10 mg total) by mouth daily.   aspirin 81 MG EC tablet Take 1 tablet (81 mg total) by mouth daily.  Swallow whole.   atorvastatin (LIPITOR) 80 MG tablet Take 1 tablet (80 mg total) by mouth daily.   carvedilol (COREG) 12.5 MG tablet Take 1 tablet (12.5 mg total) by mouth 2 (two) times daily.   clopidogrel (PLAVIX) 75 MG tablet Take 1 tablet (75 mg total) by mouth daily.   diclofenac Sodium (VOLTAREN) 1 % GEL SMARTSIG:2 Gram(s) Topical 4 Times Daily PRN   lisinopril-hydrochlorothiazide (ZESTORETIC) 20-12.5 MG tablet Take 2 tablets by mouth daily.   metFORMIN (GLUCOPHAGE) 500 MG tablet Take 1 tablet (500 mg total) by mouth 2 (two) times  daily with a meal.   nitroGLYCERIN (NITROSTAT) 0.4 MG SL tablet Place 1 tablet (0.4 mg total) under the tongue every 5 (five) minutes x 3 doses as needed for chest pain.   [DISCONTINUED] lisinopril-hydrochlorothiazide (ZESTORETIC) 20-25 MG tablet Take 1 tablet by mouth daily.     Allergies:   Patient has no known allergies.   Social History   Socioeconomic History   Marital status: Single    Spouse name: Not on file   Number of children: Not on file   Years of education: Not on file   Highest education level: Not on file  Occupational History   Not on file  Tobacco Use   Smoking status: Never   Smokeless tobacco: Never  Vaping Use   Vaping Use: Never used  Substance and Sexual Activity   Alcohol use: No   Drug use: No   Sexual activity: Yes    Birth control/protection: Surgical    Comment: BTL 1994  Other Topics Concern   Not on file  Social History Narrative   Not on file   Social Determinants of Health   Financial Resource Strain: Not on file  Food Insecurity: Not on file  Transportation Needs: Not on file  Physical Activity: Not on file  Stress: Not on file  Social Connections: Not on file     Family History: The patient's family history includes Coronary artery disease in her mother; Diabetes in her mother; Heart failure in her mother; High Cholesterol in her mother.  ROS:   Please see the history of present illness.    ++itching  ++swelling of posterior cervical lymph nodes All other systems reviewed and are negative.  Labs/Other Studies Reviewed:    The following studies were reviewed today:  LHC 02/01/21  IMPRESSION:[name] had a 70% apical LAD lesion of questionable significance.  She has no other obstructive disease and normal LV function.  I believe this can be treated medically.  The sheath was removed and a TR band was placed on the right wrist to achieve patent hemostasis.  The patient left lab in stable condition.   Echo 01/31/21  Left  Ventricle: Left ventricular ejection fraction, by estimation, is 65  to 70%. The left ventricle has normal function. The left ventricle has no  regional wall motion abnormalities. The left ventricular internal cavity  size was normal in size. There is  moderate concentric left ventricular hypertrophy. Left ventricular diastolic parameters are indeterminate.  Right Ventricle: The right ventricular size is normal. Right vetricular  wall thickness was not well visualized. Right ventricular systolic  function is normal.  Left Atrium: Left atrial size was normal in size.  Right Atrium: Right atrial size was mildly dilated.  Pericardium: There is no evidence of pericardial effusion.  Mitral Valve: The mitral valve is grossly normal. Trivial mitral valve  regurgitation. No evidence of mitral valve stenosis.  Tricuspid Valve: The tricuspid valve  is grossly normal. Tricuspid valve regurgitation is trivial. No evidence of tricuspid stenosis.  Aortic Valve: The aortic valve has an indeterminant number of cusps. There is mild calcification of the aortic valve. Aortic valve regurgitation is not visualized. Mild aortic valve sclerosis is present, with no evidence  of aortic valve stenosis.  Pulmonic Valve: The pulmonic valve was not well visualized. Pulmonic valve regurgitation is mild. No evidence of pulmonic stenosis.  Aorta: The aortic root, ascending aorta, aortic arch and descending aorta are all structurally normal, with no evidence of dilitation or  obstruction.  Venous: The inferior vena cava is normal in size with greater than 50%  respiratory variability, suggesting right atrial pressure of 3 mmHg.  IAS/Shunts: The atrial septum is grossly normal.    Recent Labs: 02/19/2021: ALT 27; BUN 9; Creatinine, Ser 0.79; Hemoglobin 11.8; Platelets 379; Potassium 4.0; Sodium 141; TSH 0.424  Recent Lipid Panel    Component Value Date/Time   CHOL 209 (H) 01/31/2021 0206   TRIG 115 01/31/2021 0206   HDL  47 01/31/2021 0206   CHOLHDL 4.4 01/31/2021 0206   VLDL 23 01/31/2021 0206   LDLCALC 139 (H) 01/31/2021 0206      Physical Exam:    VS:  BP 140/78   Pulse 78   Ht 5\' 5"  (1.651 m)   Wt 197 lb 9.6 oz (89.6 kg)   LMP 07/11/2017   SpO2 98%   BMI 32.88 kg/m     Wt Readings from Last 3 Encounters:  03/23/21 197 lb 9.6 oz (89.6 kg)  03/01/21 199 lb 9.6 oz (90.5 kg)  02/19/21 197 lb (89.4 kg)     GEN:  Well nourished, well developed in no acute distress HEENT: Normal NECK: Mild swelling to right posterior cervical lymph node, soft. No JVD; No carotid bruits LYMPHATICS: Mild swelling right post cervical lymph nodes.No additional lymphadenopathy.  CARDIAC: RRR, no murmurs, rubs, gallops RESPIRATORY:  Clear to auscultation without rales, wheezing or rhonchi  ABDOMEN: Soft, non-tender, non-distended MUSCULOSKELETAL:  No edema; No deformity  SKIN: Warm and dry NEUROLOGIC:  Alert and oriented x 3 PSYCHIATRIC:  Normal affect   EKG:  EKG is not ordered today.    Diagnoses:    1. Hypertension, unspecified type   2. Coronary artery disease involving native coronary artery of native heart, unspecified whether angina present   3. Left arm pain    Assessment and Plan:     Essential hypertension: BP is elevated today.  She is not monitoring at home because she does not have a BP cuff.  She does not report difficulty paying for medications. In order to increase her lisinopril to 40 mg, we will prescribe Zestoretic 20-12.5 mg tablets and have her take 2. Advised her not to discard the Zestoretic 20-25 tablets for potential use in the future. She reports intermittent dizziness at work, but not necessarily with position change or accompanied by arm pain. We provided her with a blood pressure monitor today and gave her a log so that she can record blood pressure readings and report to [place] if systolic blood pressure remains consistently greater than 130.  Have specifically asked her to monitor 2  hours after medication and if she is having episodes of dizziness. Plan for 1 month follow-up in hypertension clinic. BMET 7 to 10 days.  CAD native/Hyperlipidemia LDL goal < 70: She has moderate coronary disease by cath in October 2022. Medication management was recommended however she felt that the Imdur caused a rash and  swelling of lymph nodes so she has discontinued it. She does not have angina today. She has reported intermittent left arm numbness and tingling and occasional twinges in her chest. She reports an increase in symptoms when she does repetitive motion with her arms. Low suspicion for angina as symptoms have not increased with the discontinuation of Imdur. Do not feel that further ischemia evaluation is needed at this time. Need to improve blood pressure control and have increased lisinopril today. We will have her follow-up in hypertension clinic in 1 month.  Continue aspirin, Plavix, beta-blocker, amlodipine.  Left arm pain: She describes intermittent left arm pain that occurs more often with continued use.  She reports that the pain seems to come from her neck and that she also has neck cramping.  She does not have a primary care provider and there is no record of imaging of her cervical spine in her chart. I have reached out to our social workers to see if we can assist her getting established with primary care.    Disposition: 1 month with hypertension clinic, 6 months with Dr. Acie Fredrickson or APP      Medication Adjustments/Labs and Tests Ordered: Current medicines are reviewed at length with the patient today.  Concerns regarding medicines are outlined above.  Orders Placed This Encounter  Procedures   Basic Metabolic Panel (BMET)   AMB Referral to [facility]    Meds ordered this encounter  Medications   lisinopril-hydrochlorothiazide (ZESTORETIC) 20-12.5 MG tablet    Sig: Take 2 tablets by mouth daily.    Dispense:  60 tablet    Refill:  11     Patient  Instructions  Medication Instructions:   INCREASE Zestoretic two (2) tablets by mouth ( 20-12.5 mg) daily.   *If you need a refill on your cardiac medications before your next appointment, please call your pharmacy*   Lab Work:  Your physician recommends that you return for lab work ON Thursday, December 22. You can come in on the day of your appointment anytime between 7:30-4:30.   If you have labs (blood work) drawn today and your tests are completely normal, you will receive your results only by: [place] (if you have MyChart) OR A paper copy in the mail If you have any lab test that is abnormal or we need to change your treatment, we will call you to review the results.   Testing/Procedures:   Follow-Up: At [facility], you and your health needs are our priority.  As part of our continuing mission to provide you with exceptional heart care, we have created designated Provider Care Teams.  These Care Teams include your primary Cardiologist (physician) and Advanced Practice Providers (APPs -  Physician Assistants and Nurse Practitioners) who all work together to provide you with the care you need, when you need it.  We recommend signing up for the patient portal called "MyChart".  Sign up information is provided on this After Visit Summary.  MyChart is used to connect with patients for Virtual Visits (Telemedicine).  Patients are able to view lab/test results, encounter notes, upcoming appointments, etc.  Non-urgent messages can be sent to your provider as well.   To learn more about what you can do with MyChart, go to [email or web address].    Your next appointment:   1 month(s)  The format for your next appointment:   In Person  Provider:   Pharmacist on January 12 @ 4:00 pm.     Other Instructions  Please  bring to your upcoming appointment with the pharmacist.    Blood Pressure Record Sheet To take your blood pressure, you will need a blood pressure  machine. You can buy a blood pressure machine (blood pressure monitor) at your clinic, drug store, or online. When choosing one, consider: An automatic monitor that has an arm cuff. A cuff that wraps snugly around your upper arm. You should be able to fit only one finger between your arm and the cuff. A device that stores blood pressure reading results. Do not choose a monitor that measures your blood pressure from your wrist or finger. Follow your health care provider's instructions for how to take your blood pressure. To use this form: Get one reading in the morning (a.m.) before you take any medicines. Get one reading in the evening (p.m.) before supper. Take at least 2 readings with each blood pressure check. This makes sure the results are correct. Wait 1-2 minutes between measurements. Write down the results in the spaces on this form. Repeat this once a week, or as told by your health care provider. Make a follow-up appointment with your health care provider to discuss the results. Blood pressure log Date: _______________________ a.m. _____________________(1st reading) _____________________(2nd reading) p.m. _____________________(1st reading) _____________________(2nd reading) Date: _______________________ a.m. _____________________(1st reading) _____________________(2nd reading) p.m. _____________________(1st reading) _____________________(2nd reading) Date: _______________________ a.m. _____________________(1st reading) _____________________(2nd reading) p.m. _____________________(1st reading) _____________________(2nd reading) Date: _______________________ a.m. _____________________(1st reading) _____________________(2nd reading) p.m. _____________________(1st reading) _____________________(2nd reading) Date: _______________________ a.m. _____________________(1st reading) _____________________(2nd reading) p.m. _____________________(1st reading) _____________________(2nd  reading) This information is not intended to replace advice given to you by your health care provider. Make sure you discuss any questions you have with your health care provider. Document Revised: 07/16/2019 Document Reviewed: 07/17/2019 Elsevier Patient Education  2022 [place], Emmaline Life, NP  03/23/2021 11:55 AM    [place]

## 2021-03-23 ENCOUNTER — Ambulatory Visit (INDEPENDENT_AMBULATORY_CARE_PROVIDER_SITE_OTHER): Payer: 59 | Admitting: Nurse Practitioner

## 2021-03-23 ENCOUNTER — Other Ambulatory Visit: Payer: Self-pay

## 2021-03-23 ENCOUNTER — Encounter: Payer: Self-pay | Admitting: Nurse Practitioner

## 2021-03-23 VITALS — BP 140/78 | HR 78 | Ht 65.0 in | Wt 197.6 lb

## 2021-03-23 DIAGNOSIS — I2510 Atherosclerotic heart disease of native coronary artery without angina pectoris: Secondary | ICD-10-CM | POA: Diagnosis not present

## 2021-03-23 DIAGNOSIS — M79602 Pain in left arm: Secondary | ICD-10-CM

## 2021-03-23 DIAGNOSIS — I10 Essential (primary) hypertension: Secondary | ICD-10-CM

## 2021-03-23 MED ORDER — LISINOPRIL-HYDROCHLOROTHIAZIDE 20-12.5 MG PO TABS: 2.0000 | ORAL_TABLET | Freq: Every day | ORAL | 11 refills | Status: DC

## 2021-03-23 NOTE — Patient Instructions (Signed)
Medication Instructions:   INCREASE Zestoretic two (2) tablets by mouth ( 20-12.5 mg) daily.   *If you need a refill on your cardiac medications before your next appointment, please call your pharmacy*   Lab Work:  Your physician recommends that you return for lab work ON Thursday, December 22. You can come in on the day of your appointment anytime between 7:30-4:30.   If you have labs (blood work) drawn today and your tests are completely normal, you will receive your results only by: [place] (if you have MyChart) OR A paper copy in the mail If you have any lab test that is abnormal or we need to change your treatment, we will call you to review the results.   Testing/Procedures:   Follow-Up: At [facility], you and your health needs are our priority.  As part of our continuing mission to provide you with exceptional heart care, we have created designated Provider Care Teams.  These Care Teams include your primary Cardiologist (physician) and Advanced Practice Providers (APPs -  Physician Assistants and Nurse Practitioners) who all work together to provide you with the care you need, when you need it.  We recommend signing up for the patient portal called "MyChart".  Sign up information is provided on this After Visit Summary.  MyChart is used to connect with patients for Virtual Visits (Telemedicine).  Patients are able to view lab/test results, encounter notes, upcoming appointments, etc.  Non-urgent messages can be sent to your provider as well.   To learn more about what you can do with MyChart, go to [email or web address].    Your next appointment:   1 month(s)  The format for your next appointment:   In Person  Provider:   Pharmacist on January 12 @ 4:00 pm.     Other Instructions  Please bring to your upcoming appointment with the pharmacist.    Blood Pressure Record Sheet To take your blood pressure, you will need a blood pressure machine. You can buy a  blood pressure machine (blood pressure monitor) at your clinic, drug store, or online. When choosing one, consider: An automatic monitor that has an arm cuff. A cuff that wraps snugly around your upper arm. You should be able to fit only one finger between your arm and the cuff. A device that stores blood pressure reading results. Do not choose a monitor that measures your blood pressure from your wrist or finger. Follow your health care provider's instructions for how to take your blood pressure. To use this form: Get one reading in the morning (a.m.) before you take any medicines. Get one reading in the evening (p.m.) before supper. Take at least 2 readings with each blood pressure check. This makes sure the results are correct. Wait 1-2 minutes between measurements. Write down the results in the spaces on this form. Repeat this once a week, or as told by your health care provider. Make a follow-up appointment with your health care provider to discuss the results. Blood pressure log Date: _______________________ a.m. _____________________(1st reading) _____________________(2nd reading) p.m. _____________________(1st reading) _____________________(2nd reading) Date: _______________________ a.m. _____________________(1st reading) _____________________(2nd reading) p.m. _____________________(1st reading) _____________________(2nd reading) Date: _______________________ a.m. _____________________(1st reading) _____________________(2nd reading) p.m. _____________________(1st reading) _____________________(2nd reading) Date: _______________________ a.m. _____________________(1st reading) _____________________(2nd reading) p.m. _____________________(1st reading) _____________________(2nd reading) Date: _______________________ a.m. _____________________(1st reading) _____________________(2nd reading) p.m. _____________________(1st reading) _____________________(2nd reading) This information is  not intended to replace advice given to you by your health care provider. Make sure you discuss any questions you  have with your health care provider. Document Revised: 07/16/2019 Document Reviewed: 07/17/2019 Elsevier Patient Education  [place].

## 2021-03-24 ENCOUNTER — Telehealth: Payer: Self-pay | Admitting: Licensed Clinical Social Worker

## 2021-03-24 NOTE — Telephone Encounter (Signed)
LCSW attempted to reach pt to f/u on connecting her with a PCP. Listed cell phone is out of service [phone]) and so I will remove from chart; was able to reach pt significant other Herbie [place] on [phone] (DPR on file). He shares pt out of the house currently. I requested if possible for him to let the pt know that I am trying to reach pt and for her to call me back if possible. He states he will.   Westley Hummer, MSW, [place]  [phone]- work cell phone (preferred) [phone]- desk phone

## 2021-03-25 NOTE — Telephone Encounter (Signed)
Mailed pt a list of community PCPs.  May be advantageous for her to call [employer] coverage to ensure in network. I will f/u with pt again next week to try and reach her to see if she would like an appt at [facility].   Westley Hummer, MSW, [place]  [phone]- work cell phone (preferred) [phone]- desk phone

## 2021-04-01 ENCOUNTER — Other Ambulatory Visit: Payer: Self-pay

## 2021-04-01 ENCOUNTER — Other Ambulatory Visit: Payer: Self-pay | Admitting: *Deleted

## 2021-04-01 ENCOUNTER — Other Ambulatory Visit: Payer: 59 | Admitting: *Deleted

## 2021-04-01 ENCOUNTER — Encounter: Payer: Self-pay | Admitting: *Deleted

## 2021-04-01 DIAGNOSIS — R7989 Other specified abnormal findings of blood chemistry: Secondary | ICD-10-CM

## 2021-04-01 DIAGNOSIS — I2510 Atherosclerotic heart disease of native coronary artery without angina pectoris: Secondary | ICD-10-CM

## 2021-04-01 DIAGNOSIS — I10 Essential (primary) hypertension: Secondary | ICD-10-CM

## 2021-04-01 NOTE — Patient Outreach (Signed)
[facility]) Care Management Telephonic RN Care Manager Note   04/01/2021 Name:  Jacqueline Hodges MRN:  [id number] DOB:  12/27/1965  Summary: Initial screening/outreach completed for received [facility] MD referral dated on 03/23/21 with reason for consult -needs to establish primary care, repeat ED visits. Diagnoses CAD, HTN, Lymphadenopathy Noted patient listed with medicaid family planning and Bright health coverage to expire 04/10/21 and no pending coverage  Recommendations/Changes made from today's visit: Today patient was able to provide her pending new coverage and new outreach number She reports she has been to another urgent care and was directed to ED but refused to go to ED  She is very appreciative in getting assistance to find a primary care provider (PCP) Reviewed the importance of pcp services and she agrees with the importance She agrees to follow up outreach for assistance   Subjective: Jacqueline Hodges is an 55 y.o. year old female who is a primary patient of Pcp, No. The care management team was consulted for assistance with care management and/or care coordination needs.    Telephonic RN Care Manager completed Telephone Visit today.   Objective:  Medications Reviewed Today     Reviewed by Barbaraann Faster, RN (Registered Nurse) on 04/01/21 at 1731  Med List Status: <None>   Medication Order Taking? Sig Documenting Provider Last Dose Status Informant  amLODipine (NORVASC) 10 MG tablet [id number] No Take 1 tablet (10 mg total) by mouth daily. [name], PA-C Taking Active   aspirin 81 MG EC tablet [id number] No Take 1 tablet (81 mg total) by mouth daily. Swallow whole. [name], PA-C Taking Active   atorvastatin (LIPITOR) 80 MG tablet [id number] No Take 1 tablet (80 mg total) by mouth daily. [name], PA-C Taking Active   carvedilol (COREG) 12.5 MG tablet [id number] No Take 1 tablet (12.5 mg total) by mouth 2 (two) times daily.  Swinyer, Lanice Schwab, NP Taking Active   clopidogrel (PLAVIX) 75 MG tablet [id number] No Take 1 tablet (75 mg total) by mouth daily. [name], PA-C Taking Active   diclofenac Sodium (VOLTAREN) 1 % GEL [id number] No SMARTSIG:2 Gram(s) Topical 4 Times Daily PRN [provider] Taking Active   lisinopril-hydrochlorothiazide (ZESTORETIC) 20-12.5 MG tablet [id number]  Take 2 tablets by mouth daily. Swinyer, Lanice Schwab, NP  Active   metFORMIN (GLUCOPHAGE) 500 MG tablet [id number] No Take 1 tablet (500 mg total) by mouth 2 (two) times daily with a meal. [name], PA-C Taking Active   nitroGLYCERIN (NITROSTAT) 0.4 MG SL tablet [id number] No Place 1 tablet (0.4 mg total) under the tongue every 5 (five) minutes x 3 doses as needed for chest pain. [name], PA-C Taking Active   Med List Note Tollie Pizza, CPhT 01/31/21 1311): Marland Kitchen             SDOH:  (Social Determinants of Health) assessments and interventions performed:  SDOH Interventions    Flowsheet Row Most Recent Value  SDOH Interventions   Intimate Partner Violence Interventions Intervention Not Indicated  Stress Interventions Intervention Not Indicated       Care Plan  Review of patient past medical history, allergies, medications, health status, including review of consultants reports, laboratory and other test data, was performed as part of comprehensive evaluation for care management services.   Care Plan : RN Care Manager Plan of Care  Updates made by Barbaraann Faster, RN since 04/01/2021 12:00 AM     Problem: Complex Care  Coordination Needs and disease management in patient with CAD HTN   Priority: High  Onset Date: 04/01/2021     Goal: Establish Plan of Care for Management Complex SDOH Barriers, disease management and Care Coordination Needs in patient with CAD, HTN   Start Date: 04/01/2021  This Visit's Progress: Not on track       Plan: The patient has been provided with contact  information for the care management team and has been advised to call with any health related questions or concerns.  The care management team will reach out to the patient again over the next 7-14 busniess days.  Jacqueline Hodges L. Lavina Hamman, RN, BSN, [place] Coordinator Office number [phone] Main [facility] number [phone] Fax number [phone]

## 2021-04-02 ENCOUNTER — Other Ambulatory Visit: Payer: Self-pay | Admitting: *Deleted

## 2021-04-02 LAB — BASIC METABOLIC PANEL
BUN/Creatinine Ratio: 12 (ref 9–23)
BUN: 13 mg/dL (ref 6–24)
CO2: 22 mmol/L (ref 20–29)
Calcium: 9.7 mg/dL (ref 8.7–10.2)
Chloride: 97 mmol/L (ref 96–106)
Creatinine, Ser: 1.06 mg/dL — ABNORMAL HIGH (ref 0.57–1.00)
Glucose: 136 mg/dL — ABNORMAL HIGH (ref 70–99)
Potassium: 4 mmol/L (ref 3.5–5.2)
Sodium: 139 mmol/L (ref 134–144)
eGFR: 62 mL/min/{1.73_m2} (ref >59)

## 2021-04-02 LAB — T3: T3, Total: 105 ng/dL (ref 71–180)

## 2021-04-02 LAB — T4, FREE: Free T4: 1.17 ng/dL (ref 0.82–1.77)

## 2021-04-02 NOTE — Patient Outreach (Signed)
[facility]) Care Management  04/02/2021  Jacqueline Hodges [birth year] [id number]  [facility] care coordination -assistance with finding a primary care provider and the next available new patient appointment    Outreach to patient to offer the found available primary care provider options on both her present bright health and future Friday health insurance plans Pt preferences gathered (Earliest new patient appointment available with appointments either from 9-11 am or 3:45 PM and after.  Outreach to [employer] spoke with omiga who has a first new patient appointment availability  in January 2023 but will need to outreach to patient to get her 2023 Friday Health insurance coverage  Outreach to [place] [facility] [employer] with Caryl Pina who states the first new patient appointment availability starts in February and march 2023  Outreach to [facility] at [facility] [phone] Spoke with Caryl Pina who states the first new patient appointment availability is Tuesday April 13 2020 8 am Dr Louis Matte  Received a return call from [place] that pt was reached and agreed to a * 30 April 13 2021 appointment. RN Cm cancelled the appointment with [facility] internal medicine made with Amber for 04/13/20 0800   Updated patient on appointment and further screening for disease management completed  Emesis after eating toast, nothing greasy Bp varying now noticing low systolic of 194 Discussed importance of hydration, hypotension management, 24 hour nurse services,  She is documenting her vitals She gave permission to be e-mailed education information an information discussed today to include 24 hour nurse, BP parameters, hydration & BP management and loca in network [facility] urgent care centers available until 04/10/21   Plans Patient agrees to care plan and follow up within the next 7-10 business days   Tung Pustejovsky L. Lavina Hamman, RN, BSN, [place]  Coordinator Office number [phone] Main [facility] number [phone] Fax number [phone]     [place]. Lavina Hamman, RN, BSN, [place] Coordinator Office number [phone] Main [facility] number [phone] Fax number [phone]

## 2021-04-06 ENCOUNTER — Other Ambulatory Visit: Payer: Self-pay | Admitting: *Deleted

## 2021-04-06 NOTE — Patient Outreach (Addendum)
[facility]) Care Management Telephonic RN Care Manager Note   04/06/2021 Name:  Sinclaire Artiga MRN:  [id number] DOB:  03/02/1966  Summary: Ms Azizi was outreached at her home number [phone] but it went straight to her voice mail box. A HIPAA [facility] Portability and Accountability Act) compliant voicemail message along with CMs contact info was left.  A text was sent to her at this same number  Sent a my chart invite to her [phone] number   Subjective: Priyana Renier is an 55 y.o. year old female who is a primary patient of Pcp, No. The care management team was consulted for assistance with care management and/or care coordination needs.    Objective:  Medications Reviewed Today     Reviewed by Barbaraann Faster, RN (Registered Nurse) on 04/01/21 at 1731  Med List Status: <None>   Medication Order Taking? Sig Documenting Provider Last Dose Status Informant  amLODipine (NORVASC) 10 MG tablet [id number] No Take 1 tablet (10 mg total) by mouth daily. [name], PA-C Taking Active   aspirin 81 MG EC tablet [id number] No Take 1 tablet (81 mg total) by mouth daily. Swallow whole. [name], PA-C Taking Active   atorvastatin (LIPITOR) 80 MG tablet [id number] No Take 1 tablet (80 mg total) by mouth daily. [name], PA-C Taking Active   carvedilol (COREG) 12.5 MG tablet [id number] No Take 1 tablet (12.5 mg total) by mouth 2 (two) times daily. Swinyer, Lanice Schwab, NP Taking Active   clopidogrel (PLAVIX) 75 MG tablet [id number] No Take 1 tablet (75 mg total) by mouth daily. [name], PA-C Taking Active   diclofenac Sodium (VOLTAREN) 1 % GEL [id number] No SMARTSIG:2 Gram(s) Topical 4 Times Daily PRN [provider] Taking Active   lisinopril-hydrochlorothiazide (ZESTORETIC) 20-12.5 MG tablet [id number]  Take 2 tablets by mouth daily. Swinyer, Lanice Schwab, NP  Active   metFORMIN (GLUCOPHAGE) 500 MG tablet [id number] No Take  1 tablet (500 mg total) by mouth 2 (two) times daily with a meal. [name], PA-C Taking Active   nitroGLYCERIN (NITROSTAT) 0.4 MG SL tablet [id number] No Place 1 tablet (0.4 mg total) under the tongue every 5 (five) minutes x 3 doses as needed for chest pain. [name], PA-C Taking Active   Med List Note Tollie Pizza, CPhT 01/31/21 1311): Marland Kitchen             SDOH:  (Social Determinants of Health) assessments and interventions performed:    Care Plan  Review of patient past medical history, allergies, medications, health status, including review of consultants reports, laboratory and other test data, was performed as part of comprehensive evaluation for care management services.   Care Plan : RN Care Manager Plan of Care  Updates made by Barbaraann Faster, RN since 04/06/2021 12:00 AM     Problem: Complex Care Coordination Needs and disease management in patient with CAD HTN   Priority: High  Onset Date: 04/01/2021     Goal: Establish Plan of Care for Management Complex SDOH Barriers, disease management and Care Coordination Needs in patient with CAD, HTN   Start Date: 04/01/2021  Recent Progress: Not on track  Note:   Current Barriers:  Knowledge Deficits related to plan of care for management of CAD and HTN  Care Coordination needs related to Limited education about CAD, HTN* and Limited access to caregiver Barriers:Health Behaviors Financial Knowledge Other - Lack of pcp 04/01/21  RN CM Clinical Goal(s):  Patient will work with RN CM  to find a primary care provider for ongoing care of CAD, HTN management  as evidenced by decrease in ED and urgent care visits  through collaboration with RN Care manager, provider, and care team.  Barriers: Health Behaviors Knowledge Other Her telephone accessibility  !06/07/20 unsuccessful outreach to [phone] (message left)- My chart invite sent to this number   Interventions: Further outreaches to patient and  medical providers to assist with care coordination and disease management Inter-disciplinary care team collaboration (see longitudinal plan of care) Evaluation of current treatment plan related to  self management and patient's adherence to plan as established by provider   CAD Interventions: (Status:  New goal.) Long Term Goal Assessed understanding of CAD diagnosis Medications reviewed including medications utilized in CAD treatment plan Reviewed Importance of attending all scheduled provider appointments   Hypertension Interventions:  (Status:  New goal.) Short Term Goal Last practice recorded BP readings:  BP Readings from Last 3 Encounters:  03/23/21 140/78  03/01/21 140/70  02/19/21 (!) 142/96  Most recent eGFR/CrCl:  Lab Results  Component Value Date   EGFR 88 02/19/2021    No components found for: CRCL  Discussed the importance of pcp services and attending pcp appointments in the management of HTN   Patient Goals/Self-Care Activities: Attend all scheduled provider appointments Follow up on primary care provider choices offered  Follow Up Plan:  The patient has been provided with contact information for the care management team and has been advised to call with any health related questions or concerns.  The care management team will reach out to the patient again over the next 7-14 business days.       Plan: Plan: [facility] RN CM scheduled this patient for another call attempt within 4-7 business days Unsuccessful outreach on 04/06/21    Dakai Braithwaite L. Lavina Hamman, RN, BSN, [place] Coordinator Office number [phone] Main [facility] number [phone] Fax number [phone]

## 2021-04-09 ENCOUNTER — Telehealth: Payer: Self-pay | Admitting: Nurse Practitioner

## 2021-04-09 NOTE — Telephone Encounter (Signed)
Informed pt of results. Pt verbalized understanding. 

## 2021-04-09 NOTE — Telephone Encounter (Signed)
Patient returning call for lab results. 

## 2021-04-13 ENCOUNTER — Other Ambulatory Visit: Payer: Self-pay | Admitting: *Deleted

## 2021-04-13 NOTE — Patient Outreach (Addendum)
[facility]) Care Management  04/13/2021  Miyah Middlesworth Jul 07, 1965 [id number]   THN Unsuccessful outreach   Outreach attempt to the listed at the preferred outreach number in EPIC [phone]  No answer. THN RN CM left HIPAA [facility] Portability and Accountability Act) compliant voicemail message along with CM's contact info.   Outreach to patient at Mr Lovena Le number  He reports she did attend her scheduled appointment today without any voiced concerns He reports she is presently in a store  RN CM requested he have her to return a call to RN CM office number   Plan: [facility] RN CM scheduled this patient for another call attempt within 4-7 business days Unsuccessful outreach letter mailed 04/13/21 to patient  Joelene Millin L. Lavina Hamman, RN, BSN, [place] Coordinator Office number [phone] Mobile number [phone]  Main THN number [phone] Fax number [phone]

## 2021-04-19 ENCOUNTER — Other Ambulatory Visit: Payer: Self-pay | Admitting: *Deleted

## 2021-04-19 NOTE — Patient Outreach (Signed)
[facility]) Care Management Telephonic RN Care Manager Note   04/19/2021 Name:  Jacqueline Hodges MRN:  [id number] DOB:  06/08/1965  Summary: Patient returned a call to RN CM  She reports she is doing fair Continues with an elevated Blood pressure but has a follow up with cardiology on Thursday 04/22/21   Recommendations/Changes made from today's visit: Discussed her neuropathy, right leg pain and circulation concerns as possible origins of pain leading to elevated BPs   THN progression discussed  Subjective: Jacqueline Hodges is an 56 y.o. year old female who is a primary patient of [name], NP. The care management team was consulted for assistance with care management and/or care coordination needs.    Telephonic RN Care Manager completed Telephone Visit today.   Objective:  Medications Reviewed Today     Reviewed by Barbaraann Faster, RN (Registered Nurse) on 04/19/21 at 1252  Med List Status: <None>   Medication Order Taking? Sig Documenting Provider Last Dose Status Informant  amLODipine (NORVASC) 10 MG tablet [id number]  Take 1 tablet (10 mg total) by mouth daily. [name], PA-C  Active   amLODipine (NORVASC) 5 MG tablet [id number]  Take 5 mg by mouth daily. [provider]  Active   aspirin 81 MG EC tablet [id number]  Take 1 tablet (81 mg total) by mouth daily. Swallow whole. [name], PA-C  Active   atorvastatin (LIPITOR) 80 MG tablet [id number]  Take 1 tablet (80 mg total) by mouth daily. [name], PA-C  Active   carvedilol (COREG) 12.5 MG tablet [id number]  Take 1 tablet (12.5 mg total) by mouth 2 (two) times daily. Swinyer, Lanice Schwab, NP  Active   clopidogrel (PLAVIX) 75 MG tablet [id number]  Take 1 tablet (75 mg total) by mouth daily. Sande Rives E, PA-C  Active   diclofenac Sodium (VOLTAREN) 1 % GEL [id number]  SMARTSIG:2 Gram(s) Topical 4 Times Daily PRN [provider]  Active   famotidine (PEPCID) 40 MG  tablet [id number]  Take 40 mg by mouth at bedtime. [provider]  Active   isosorbide mononitrate (IMDUR) 30 MG 24 hr tablet [id number]  Take 30 mg by mouth daily. [provider]  Active   lisinopril-hydrochlorothiazide (ZESTORETIC) 20-12.5 MG tablet [id number]  Take 2 tablets by mouth daily. Swinyer, Lanice Schwab, NP  Active   metFORMIN (GLUCOPHAGE) 500 MG tablet [id number]  Take 1 tablet (500 mg total) by mouth 2 (two) times daily with a meal. Sande Rives E, PA-C  Active   nitroGLYCERIN (NITROSTAT) 0.4 MG SL tablet [id number]  Place 1 tablet (0.4 mg total) under the tongue every 5 (five) minutes x 3 doses as needed for chest pain. [name], PA-C  Active   rOPINIRole (REQUIP) 0.5 MG tablet [id number]  Take by mouth. [provider]  Active   Med List Note Tollie Pizza, CPhT 01/31/21 1311): Marland Kitchen             SDOH:  (Social Determinants of Health) assessments and interventions performed:    Care Plan  Review of patient past medical history, allergies, medications, health status, including review of consultants reports, laboratory and other test data, was performed as part of comprehensive evaluation for care management services.   Care Plan : RN Care Manager Plan of Care  Updates made by Barbaraann Faster, RN since 04/19/2021 12:00 AM     Problem: Complex Care Coordination Needs and disease management in patient with CAD HTN  Priority: High  Onset Date: 04/01/2021     Goal: Establish Plan of Care for Management Complex SDOH Barriers, disease management and Care Coordination Needs in patient with CAD, HTN   Start Date: 04/01/2021  Expected End Date: 04/23/2021  This Visit's Progress: On track  Recent Progress: Not on track  Priority: High  Note:   Current Barriers:  Knowledge Deficits related to plan of care for management of CAD and HTN  Care Coordination needs related to Limited education about CAD, HTN* and Limited access to  caregiver Barriers:Health Behaviors Financial Knowledge Other - Lack of pcp 04/01/21 Referred to [facility] on 03/23/21 for assist with establishing a pcp, repeated ED visits   RN CM Clinical Goal(s):  Patient will work with RN CM  to find a primary care provider for ongoing care of CAD, HTN management  as evidenced by decrease in ED and urgent care visits  through collaboration with RN Care manager, provider, and care team.  Barriers: Health Behaviors Knowledge Other Her telephone accessibility  !06/07/20 unsuccessful outreach to [phone] (message left)- My chart invite sent to this number Sent unsuccessful outreach letter 04/13/21 unsuccessful outreach left message with her female friend 04/19/21 unsuccessful outreach left message on voice mail box; pend for case closure  Interventions: Further outreaches to patient and medical providers to assist with care coordination and disease management Inter-disciplinary care team collaboration (see longitudinal plan of care) Evaluation of current treatment plan related to  self management and patient's adherence to plan as established by provider   CAD Interventions: (Status:   unable to address, unsuccessful outreach ) Long Term Goal Assessed understanding of CAD diagnosis Medications reviewed including medications utilized in CAD treatment plan Reviewed Importance of attending all scheduled provider appointments   Hypertension Interventions:  (Status:    unable to address, unsuccessful outreach  ) Short Term Goal Last practice recorded BP readings:  BP Readings from Last 3 Encounters:  03/23/21 140/78  03/01/21 140/70  02/19/21 (!) 142/96  Most recent eGFR/CrCl:  Lab Results  Component Value Date   EGFR 62 04/01/2021    No components found for: CRCL  Discussed the importance of pcp services and attending pcp appointments in the management of HTN   Patient Goals/Self-Care Activities: Attend all scheduled provider appointments Follow up  on primary care provider choices offered  Follow Up Plan:  The patient has been provided with contact information for the care management team and has been advised to call with any health related questions or concerns.        Plan: The care management team will reach out to the patient again over the next 30 business days.  Gladys Deckard L. Lavina Hamman, RN, BSN, [place] Coordinator Office number [phone] Main [facility] number [phone] Fax number [phone]

## 2021-04-19 NOTE — Patient Outreach (Signed)
[facility]) Care Management  04/19/2021  Jacqueline Hodges [birth year] [id number]   THN Unsuccessful outreach #3   Outreach attempt to the listed at the preferred outreach number in EPIC  No answer. THN RN CM left HIPAA [facility] Portability and Accountability Act) compliant voicemail message along with CMs contact info.   Outreach to [employer] [phone] Spoke with Jacqueline Hodges to confirm pt saw Jacqueline Pilling, NP on 04/13/21 and has labs on 05/13/21 and a follow up monthly visit to see Jacqueline Hodges on 05/20/21  0830 EPIC updated -Referral Goal met   Plan: [facility] RN CM scheduled this patient for pending case closure Unsuccessful outreach letter sent on 04/13/21 Unsuccessful outreach on 04/06/21, 04/13/21, 04/19/21   Jacqueline Hodges L. Jacqueline Hamman, RN, BSN, [place] Coordinator Office number [phone] Mobile number [phone]  Main [facility]N number [phone] Fax number [phone]

## 2021-04-22 ENCOUNTER — Ambulatory Visit (INDEPENDENT_AMBULATORY_CARE_PROVIDER_SITE_OTHER): Payer: 59 | Admitting: Pharmacist

## 2021-04-22 ENCOUNTER — Other Ambulatory Visit: Payer: Self-pay

## 2021-04-22 VITALS — BP 152/80 | HR 81

## 2021-04-22 DIAGNOSIS — I10 Essential (primary) hypertension: Secondary | ICD-10-CM | POA: Diagnosis not present

## 2021-04-22 MED ORDER — CLOPIDOGREL BISULFATE 75 MG PO TABS: 75.0000 mg | ORAL_TABLET | Freq: Every day | ORAL | 3 refills | Status: DC

## 2021-04-22 MED ORDER — AMLODIPINE BESYLATE 10 MG PO TABS: 10.0000 mg | ORAL_TABLET | Freq: Every day | ORAL | 3 refills | Status: AC

## 2021-04-22 MED ORDER — ATORVASTATIN CALCIUM 80 MG PO TABS: 80.0000 mg | ORAL_TABLET | Freq: Every day | ORAL | 3 refills | Status: AC

## 2021-04-22 MED ORDER — ASPIRIN 81 MG PO TBEC: 81.0000 mg | DELAYED_RELEASE_TABLET | Freq: Every day | ORAL | 3 refills | Status: DC

## 2021-04-22 NOTE — Patient Instructions (Addendum)
Please start taking amlodipine 10mg  daily. You may take 2 of the 5mg  tablet to equal 10mg . Continue lisinopril/HCTZ 20/12.5mg  2 tablets daily, carvedilol 12.5mg  twice a day and isosorbide 30mg  daily Please decrease your intake of soda, tea and juice  Please call me at [phone] with any questions

## 2021-04-22 NOTE — Progress Notes (Signed)
Patient ID: Jacqueline Hodges                 DOB: 1966/01/25                      MRN: [id number]     HPI: Jacqueline Hodges is a 56 y.o. female patient of Dr. Acie Fredrickson referred by Christen Bame to HTN clinic. PMH is significant for chest pain, NSTEMI, CAD with moderate stenosis in distal LAD, DM, hyperlipidemia, migraine, and HTN. Lisinopril was increased to 40mg  at last visit with [facility]. Imdur was stopped due to rash.  Patient presents today accompanied by her husband. She brings in all her medication bottles and her blood pressure cuff. She has been without her plavix and atorvastatin for about a week. She saw her PCP for leg pain and an uncontrollable cough. She was started on requip and pepcid which has helped. Patient has been taking an extra lisinopril/HCTZ 20/12.5mg  (total of 60mg  of lisinopril) if her BP is high. Has been checking BP about 2 times a day. Checks BP in bed at home but at a table at work.  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 She admits to some blurred vision and dizziness. She has checked her blood pressure when she is dizzy and it is high. Home cuff compared to clinic cuff found to be pretty accurate. Diastolic a little higher on home cuff.  Home cuff 154/94  Current HTN meds: lisinopril/HCTZ 40/25mg , amlodipine 5mg  daily, carvedilol 12.5mg  twice a day, isosorbide 30mg  daily BP goal: <130/80  Family History: The patient's family history includes Coronary artery disease in her mother; Diabetes in her mother; Heart failure in her mother; High Cholesterol in her mother.  Social History: never smoked, no alcohol  Diet: some soda, some tea, occassionally coffee, drinks cranberry jucie   Exercise: works in [employer], is active at home with her grand kids   Home BP readings: average 152/91-, 133/89, 160/95, 163/91,163/87, 159/91,161/92, 133/76, 143/92, 151/79, 144/87, 114/78, 137/78,   Wt Readings from Last 3 Encounters:  03/23/21 197 lb 9.6 oz (89.6 kg)   03/01/21 199 lb 9.6 oz (90.5 kg)  02/19/21 197 lb (89.4 kg)   BP Readings from Last 3 Encounters:  03/23/21 140/78  03/01/21 140/70  02/19/21 (!) 142/96   Pulse Readings from Last 3 Encounters:  03/23/21 78  03/01/21 69  02/19/21 78    Renal function: CrCl cannot be calculated (Unknown ideal weight.).  Past Medical History:  Diagnosis Date   Abnormal perimenopausal bleeding 02/20/2017   CAD in native artery    a. NSTEMI 02/2021 with 70% dLAD, 25% mRCA but dLAD felt of uncertain significance, treated medically.   Cervical radiculopathy 01/12/2017   Diabetes mellitus ([place])    Fibroid uterus 02/20/2017   Hyperlipidemia LDL goal <70    Hypertension    Migraine with aura and without status migrainosus, not intractable 01/12/2017   Precordial chest pain     Current Outpatient Medications on File Prior to Visit  Medication Sig Dispense Refill   amLODipine (NORVASC) 10 MG tablet Take 1 tablet (10 mg total) by mouth daily. 30 tablet 2   amLODipine (NORVASC) 5 MG tablet Take 5 mg by mouth daily.     aspirin 81 MG EC tablet Take 1 tablet (81 mg total) by mouth daily. Swallow whole. 30 tablet 11   atorvastatin (LIPITOR) 80 MG tablet Take 1 tablet (80 mg total) by mouth daily. 30 tablet 5   carvedilol (  COREG) 12.5 MG tablet Take 1 tablet (12.5 mg total) by mouth 2 (two) times daily. 180 tablet 3   clopidogrel (PLAVIX) 75 MG tablet Take 1 tablet (75 mg total) by mouth daily. 30 tablet 11   diclofenac Sodium (VOLTAREN) 1 % GEL SMARTSIG:2 Gram(s) Topical 4 Times Daily PRN     famotidine (PEPCID) 40 MG tablet Take 40 mg by mouth at bedtime.     isosorbide mononitrate (IMDUR) 30 MG 24 hr tablet Take 30 mg by mouth daily.     lisinopril-hydrochlorothiazide (ZESTORETIC) 20-12.5 MG tablet Take 2 tablets by mouth daily. 60 tablet 11   metFORMIN (GLUCOPHAGE) 500 MG tablet Take 1 tablet (500 mg total) by mouth 2 (two) times daily with a meal. 60 tablet 2   nitroGLYCERIN (NITROSTAT) 0.4 MG SL  tablet Place 1 tablet (0.4 mg total) under the tongue every 5 (five) minutes x 3 doses as needed for chest pain. 25 tablet 2   rOPINIRole (REQUIP) 0.5 MG tablet Take by mouth.     No current facility-administered medications on file prior to visit.    No Known Allergies  Last menstrual period 07/11/2017.   Assessment/Plan:  1. Hypertension - Blood pressure above goal of <130/80 in clinic. Patient reports adherence with medications. I advised her that she should not take an additional lisinopril/HCTZ because she is already at the max dose of lisinopril (40mg ). Will increase amlodipine to 10mg  daily. Continue carvedilol 12.5mg  twice a day, lisinopril/HCTZ 20/12.5mg  2 tablets daily and isosorbide 30mg  daily. I encouraged her to increase her physical activity by going for a 5-10 min walk. Decrease intake of sugary drinks as her blurred vision could be from high blood sugar or high blood pressure. She is following up with her PCP about her vision, blood sugar and leg pain. Follow up with me in 2 week. Refills for atorvastatin, plavix and ASA sent to pharmacy. I advised her that these are very important medications and she should always call us before she runs out.  Thank you  Ramond Dial, Pharm.D, BCPS, CPP [place]  [address] [place], [place] [address]  Phone: [phone]; Fax: [phone]

## 2021-05-06 ENCOUNTER — Ambulatory Visit: Payer: 59

## 2021-05-06 NOTE — Progress Notes (Deleted)
Patient ID: Jacqueline Hodges                 DOB: 1965/10/30                      MRN: [id number]     HPI: Jacqueline Hodges is a 56 y.o. female patient of Dr. Acie Fredrickson referred by Christen Bame to HTN clinic. PMH is significant for chest pain, NSTEMI, CAD with moderate stenosis in distal LAD, DM, hyperlipidemia, migraine, and HTN. Lisinopril was increased to 40mg  at last visit with [facility]. Imdur was stopped due to rash (however resumed by patient because she decided rash was not from isosorbide).  At last visit blood pressure was 152/80. Amlodipine was increased to 10mg  daily.  BP/HR Dizziness, lightheadedness, headache, blurred vision, SOB, swelling Arlyce Harman? Increase carvedilol? Exercise?    Patient presents today accompanied by her husband. She brings in all her medication bottles and her blood pressure cuff. She has been without her plavix and atorvastatin for about a week. She saw her PCP for leg pain and an uncontrollable cough. She was started on requip and pepcid which has helped. Patient has been taking an extra lisinopril/HCTZ 20/12.5mg  (total of 60mg  of lisinopril) if her BP is high. Has been checking BP about 2 times a day. Checks BP in bed at home but at a table at work.  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 She admits to some blurred vision and dizziness. She has checked her blood pressure when she is dizzy and it is high. Home cuff compared to clinic cuff found to be pretty accurate. Diastolic a little higher on home cuff.  Home cuff 154/94  Current HTN meds: lisinopril/HCTZ 40/25mg , amlodipine 10mg  daily, carvedilol 12.5mg  twice a day, isosorbide 30mg  daily BP goal: <130/80  Family History: The patient's family history includes Coronary artery disease in her mother; Diabetes in her mother; Heart failure in her mother; High Cholesterol in her mother.  Social History: never smoked, no alcohol  Diet: some soda, some tea, occassionally coffee, drinks cranberry jucie    Exercise: works in [employer], is active at home with her grand kids   Home BP readings:   Wt Readings from Last 3 Encounters:  03/23/21 197 lb 9.6 oz (89.6 kg)  03/01/21 199 lb 9.6 oz (90.5 kg)  02/19/21 197 lb (89.4 kg)   BP Readings from Last 3 Encounters:  04/22/21 (!) 152/80  03/23/21 140/78  03/01/21 140/70   Pulse Readings from Last 3 Encounters:  04/22/21 81  03/23/21 78  03/01/21 69    Renal function: CrCl cannot be calculated (Patient's most recent lab result is older than the maximum 21 days allowed.).  Past Medical History:  Diagnosis Date   Abnormal perimenopausal bleeding 02/20/2017   CAD in native artery    a. NSTEMI 02/2021 with 70% dLAD, 25% mRCA but dLAD felt of uncertain significance, treated medically.   Cervical radiculopathy 01/12/2017   Diabetes mellitus ([place])    Fibroid uterus 02/20/2017   Hyperlipidemia LDL goal <70    Hypertension    Migraine with aura and without status migrainosus, not intractable 01/12/2017   Precordial chest pain     Current Outpatient Medications on File Prior to Visit  Medication Sig Dispense Refill   amLODipine (NORVASC) 10 MG tablet Take 1 tablet (10 mg total) by mouth daily. 90 tablet 3   aspirin 81 MG EC tablet Take 1 tablet (81 mg total) by mouth daily. Swallow whole. [address]  tablet 3   atorvastatin (LIPITOR) 80 MG tablet Take 1 tablet (80 mg total) by mouth daily. 90 tablet 3   carvedilol (COREG) 12.5 MG tablet Take 1 tablet (12.5 mg total) by mouth 2 (two) times daily. 180 tablet 3   clopidogrel (PLAVIX) 75 MG tablet Take 1 tablet (75 mg total) by mouth daily. 90 tablet 3   diclofenac Sodium (VOLTAREN) 1 % GEL SMARTSIG:2 Gram(s) Topical 4 Times Daily PRN     famotidine (PEPCID) 40 MG tablet Take 40 mg by mouth at bedtime.     isosorbide mononitrate (IMDUR) 30 MG 24 hr tablet Take 30 mg by mouth daily.     lisinopril-hydrochlorothiazide (ZESTORETIC) 20-12.5 MG tablet Take 2 tablets by mouth daily. 60 tablet 11    metFORMIN (GLUCOPHAGE) 500 MG tablet Take 1 tablet (500 mg total) by mouth 2 (two) times daily with a meal. 60 tablet 2   nitroGLYCERIN (NITROSTAT) 0.4 MG SL tablet Place 1 tablet (0.4 mg total) under the tongue every 5 (five) minutes x 3 doses as needed for chest pain. 25 tablet 2   rOPINIRole (REQUIP) 0.5 MG tablet Take 0.25 mg by mouth.     No current facility-administered medications on file prior to visit.    No Known Allergies  Last menstrual period 07/11/2017.   Assessment/Plan:  1. Hypertension - Blood pressure above goal of <130/80 in clinic. Patient reports adherence with medications. I advised her that she should not take an additional lisinopril/HCTZ because she is already at the max dose of lisinopril (40mg ). Will increase amlodipine to 10mg  daily. Continue carvedilol 12.5mg  twice a day, lisinopril/HCTZ 20/12.5mg  2 tablets daily and isosorbide 30mg  daily. I encouraged her to increase her physical activity by going for a 5-10 min walk. Decrease intake of sugary drinks as her blurred vision could be from high blood sugar or high blood pressure. She is following up with her PCP about her vision, blood sugar and leg pain. Follow up with me in 2 week. Refills for atorvastatin, plavix and ASA sent to pharmacy. I advised her that these are very important medications and she should always call us before she runs out.  Thank you  Ramond Dial, Pharm.D, BCPS, CPP [place]  [address], [place], [place] [address]  Phone: [phone]; Fax: [phone]

## 2021-05-19 ENCOUNTER — Telehealth (HOSPITAL_COMMUNITY): Payer: Self-pay

## 2021-05-19 ENCOUNTER — Encounter (HOSPITAL_COMMUNITY): Payer: Self-pay

## 2021-05-19 NOTE — Telephone Encounter (Signed)
Attempted to call patient in regards to Cardiac Rehab - unable to leave VM, VM box is full.   Mailed letter

## 2021-05-25 ENCOUNTER — Other Ambulatory Visit: Payer: Self-pay

## 2021-05-25 ENCOUNTER — Ambulatory Visit (INDEPENDENT_AMBULATORY_CARE_PROVIDER_SITE_OTHER): Payer: 59 | Admitting: Pharmacist

## 2021-05-25 VITALS — BP 118/60 | HR 76

## 2021-05-25 DIAGNOSIS — I10 Essential (primary) hypertension: Secondary | ICD-10-CM

## 2021-05-25 NOTE — Patient Instructions (Signed)
Continue taking: lisinopril/HCTZ 40/25mg , amlodipine 10mg  daily, carvedilol 12.5mg  twice a day, isosorbide 30mg  daily  I will call you in a month to follow up on blood pressure.  Please call me at [phone] with any questions

## 2021-05-25 NOTE — Progress Notes (Signed)
Patient ID: Jacqueline Hodges                 DOB: January 23, 1966                      MRN: [id number]     HPI: Jacqueline Hodges is a 56 y.o. female patient of Dr. Acie Fredrickson referred by Christen Bame to HTN clinic. PMH is significant for chest pain, NSTEMI, CAD with moderate stenosis in distal LAD, DM, hyperlipidemia, migraine, and HTN. Lisinopril was increased to 40mg  at last visit with [facility]. Imdur was stopped due to rash (however resumed by patient because she decided rash was not from isosorbide).  At last visit blood pressure was 152/80. Amlodipine was increased to 10mg  daily.  Patient presents today to clinic. Has been checking BP about 2-3 times a day. She did not bring in her readings however. She tells me that most of her readings have been 130-132/70's. She had 1 high reading. Went to the dentist and her BP was high, but states they used a wrist cuff and she had been moving around. BP at her PCP the next day was 124/76.  She did have a headache for 2 days, but does not think its related to BP. She has a hx of headaches. Has a little swelling in feet that improves with elevation. Denies lightheadedness.  Current HTN meds: lisinopril/HCTZ 40/25mg , amlodipine 10mg  daily, carvedilol 12.5mg  twice a day, isosorbide 30mg  daily BP goal: <130/80  Family History: The patient's family history includes Coronary artery disease in her mother; Diabetes in her mother; Heart failure in her mother; High Cholesterol in her mother.  Social History: never smoked, no alcohol  Diet: some soda, some tea, occassionally coffee, drinks cranberry jucie   Exercise: works in [employer], is active at home with her grand kids   Home BP readings: mostly in the 130's /78-79 1 high reading 366 systolic  Wt Readings from Last 3 Encounters:  03/23/21 197 lb 9.6 oz (89.6 kg)  03/01/21 199 lb 9.6 oz (90.5 kg)  02/19/21 197 lb (89.4 kg)   BP Readings from Last 3 Encounters:  04/22/21 (!) 152/80  03/23/21 140/78   03/01/21 140/70   Pulse Readings from Last 3 Encounters:  04/22/21 81  03/23/21 78  03/01/21 69    Renal function: CrCl cannot be calculated (Patient's most recent lab result is older than the maximum 21 days allowed.).  Past Medical History:  Diagnosis Date   Abnormal perimenopausal bleeding 02/20/2017   CAD in native artery    a. NSTEMI 02/2021 with 70% dLAD, 25% mRCA but dLAD felt of uncertain significance, treated medically.   Cervical radiculopathy 01/12/2017   Diabetes mellitus ([place])    Fibroid uterus 02/20/2017   Hyperlipidemia LDL goal <70    Hypertension    Migraine with aura and without status migrainosus, not intractable 01/12/2017   Precordial chest pain     Current Outpatient Medications on File Prior to Visit  Medication Sig Dispense Refill   amLODipine (NORVASC) 10 MG tablet Take 1 tablet (10 mg total) by mouth daily. 90 tablet 3   aspirin 81 MG EC tablet Take 1 tablet (81 mg total) by mouth daily. Swallow whole. 90 tablet 3   atorvastatin (LIPITOR) 80 MG tablet Take 1 tablet (80 mg total) by mouth daily. 90 tablet 3   carvedilol (COREG) 12.5 MG tablet Take 1 tablet (12.5 mg total) by mouth 2 (two) times daily. 180 tablet 3   clopidogrel (  PLAVIX) 75 MG tablet Take 1 tablet (75 mg total) by mouth daily. 90 tablet 3   diclofenac Sodium (VOLTAREN) 1 % GEL SMARTSIG:2 Gram(s) Topical 4 Times Daily PRN     famotidine (PEPCID) 40 MG tablet Take 40 mg by mouth at bedtime.     isosorbide mononitrate (IMDUR) 30 MG 24 hr tablet Take 30 mg by mouth daily.     lisinopril-hydrochlorothiazide (ZESTORETIC) 20-12.5 MG tablet Take 2 tablets by mouth daily. 60 tablet 11   metFORMIN (GLUCOPHAGE) 500 MG tablet Take 1 tablet (500 mg total) by mouth 2 (two) times daily with a meal. 60 tablet 2   nitroGLYCERIN (NITROSTAT) 0.4 MG SL tablet Place 1 tablet (0.4 mg total) under the tongue every 5 (five) minutes x 3 doses as needed for chest pain. 25 tablet 2   rOPINIRole (REQUIP) 0.5 MG  tablet Take 0.25 mg by mouth.     No current facility-administered medications on file prior to visit.    No Known Allergies  Last menstrual period 07/11/2017.   Assessment/Plan:  1. Hypertension - Blood pressure at goal of <130/80 in clinic. Patient reports adherence with medications. Blood pressure much improved. Home blood pressure just slightly above goal. Continue lisinopril/HCTZ 40/25mg , amlodipine 10mg  daily, carvedilol 12.5mg  twice a day, isosorbide 30mg  daily. I will call patient in 1 month to follow up.  Thank you  Ramond Dial, Pharm.D, BCPS, CPP [facility]  [address], [place], [place] [address]  Phone: [phone]; Fax: [phone]

## 2021-05-28 ENCOUNTER — Other Ambulatory Visit: Payer: Self-pay | Admitting: *Deleted

## 2021-05-28 NOTE — Patient Outreach (Signed)
[facility]) Care Management  05/28/2021  Zya Landen [birth year] [id number]   [facility] Unsuccessful outreach   Outreach attempt to the listed at the preferred outreach number in EPIC [phone] Dropped call/cancelled No answer. [facility] RN CM unable to leave a HIPAA ([place]) compliant voicemail message along with CMs contact info as her voice mail box is full  Plan: [place] CM scheduled this patient for another call attempt within 30+ business days Unsuccessful outreach letter sent on 05/28/21 Unsuccessful outreach on 05/28/21   Joelene Millin L. Lavina Hamman, RN, BSN, [place] Coordinator Office number [phone]

## 2021-06-03 ENCOUNTER — Other Ambulatory Visit: Payer: Self-pay | Admitting: *Deleted

## 2021-06-03 ENCOUNTER — Telehealth (HOSPITAL_COMMUNITY): Payer: Self-pay

## 2021-06-03 NOTE — Telephone Encounter (Signed)
No response from pt in regards to Cardiac Rehab. Closed referral               

## 2021-06-03 NOTE — Patient Outreach (Signed)
[facility]) Care Management  06/03/2021  Jacqueline Hodges June 20, 1965 [id number]   [place] coordination incoming call from patient  [name] returned a call to RN CM  RN CM greeted her as [facility] RN CM  [name] requested RN CM to repeat the greeting and then informed RN CM she had the wrong number and disconnected before RN CM was able to make other statements RN CM attempted return the call but unable to leave a voice message as the voice mail box remains full  Plan: [facility] RN CM scheduled this patient for another call attempt within 30+ business days E-mail sent to patient requesting a return call to RN CM and cardiac rehab with cardiac rehab information pamphlet on 06/03/21 Unsuccessful outreach letter sent on 05/28/21 Unsuccessful outreach on 05/28/21, 06/03/21 x 2   [name] L. Lavina Hamman, RN, BSN, [place] Coordinator Office number [phone]

## 2021-06-03 NOTE — Patient Outreach (Signed)
[place]) Care Management  06/03/2021  Jacqueline Hodges 16-Apr-1965 [id number]   [facility] Unsuccessful outreach#2     Outreach attempt to the listed at the preferred outreach number in EPIC [phone] No answer. [facility] RN CM unable to leave a HIPAA ([place]) compliant voicemail message along with CMs contact info as her voice mail box is full   Plan: [place] CM scheduled this patient for another call attempt within 30+ business days E-mail sent to patient requesting a return call to RN CM and cardiac rehab with cardiac rehab information pamphlet on 06/03/21 Unsuccessful outreach letter sent on 05/28/21 Unsuccessful outreach on 05/28/21, 06/03/21  note routed to MD, cardiology   Sarita. Lavina Hamman, RN, BSN, [place] Coordinator Office number [phone]

## 2021-06-07 ENCOUNTER — Other Ambulatory Visit: Payer: Self-pay

## 2021-06-07 ENCOUNTER — Encounter: Payer: Self-pay | Admitting: *Deleted

## 2021-06-07 ENCOUNTER — Other Ambulatory Visit: Payer: Self-pay | Admitting: *Deleted

## 2021-06-07 NOTE — Patient Outreach (Addendum)
[facility]) Care Management Telephonic RN Care Manager Note   06/08/2021 Name:  Jacqueline Hodges MRN:  [id number] DOB:  10-28-1965  Summary: Successful follow up outreach (called her and she called RN CM as her phone was ringing) She reports she feels she is improving with her cardiac concerns She continues to monitor BP at home, take her medications at home at night (Continue lisinopril/HCTZ 40/25 mg, amlodipine 10 mg daily, carvedilol 12.5 mg twice a day, isosorbide 30 mg daily.) and attend all cardiac appointment She does voice that she notices her HTN medications causes her to become sleepy but the BP value is generally good 2-3 hours after she takes her medicine She continues to work and has not voiced interest in cardiac rehab at this time  Her concern voiced today includes swelling of her feet and pain of her right knee and back Ms [place] foot swells daily as she is on her feet constantly  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 she feels is related to her hx of 3 right knee surgeries  She is aware that a provider shared with her that the pain may be related to sciatica symptoms   To follow up with pcp on 06/17/21   Recommendations/Changes made from today's visit: Discussed the change in her insurance plan to [employer] that now participates with [facility] for further services Assessed for any worsening symptoms related to HTN, CAD Inquired about any new concerns Discussed the pathophysiology of the sciatica nerve/pain Discussed use of compression hose, benefits of PT, positioning,  Outpatient therapy and stabilizing the affected area to prevent risk of twisting or re- injury Discussed outreach to pcp for possible order/prescriptions for compression hose, outpatient therapy    Subjective: Jacqueline Hodges is an 56 y.o. year old female who is a primary patient of [name], NP. The care management team was consulted for assistance with care  management and/or care coordination needs.    Telephonic RN Care Manager completed Telephone Visit today.   Objective:  Medications Reviewed Today     Reviewed by Barbaraann Faster, RN (Registered Nurse) on 06/08/21 at 32  Med List Status: <None>   Medication Order Taking? Sig Documenting Provider Last Dose Status Informant  amLODipine (NORVASC) 10 MG tablet [id number] No Take 1 tablet (10 mg total) by mouth daily. Nahser, Wonda Cheng, MD Taking Active   aspirin 81 MG EC tablet [id number] No Take 1 tablet (81 mg total) by mouth daily. Swallow whole. Nahser, Wonda Cheng, MD Taking Active   atorvastatin (LIPITOR) 80 MG tablet [id number] No Take 1 tablet (80 mg total) by mouth daily. Nahser, Wonda Cheng, MD Taking Active   carvedilol (COREG) 12.5 MG tablet [id number] No Take 1 tablet (12.5 mg total) by mouth 2 (two) times daily. Swinyer, Lanice Schwab, NP Taking Expired 05/20/21 2359   carvedilol (COREG) 25 MG tablet [id number] No 1 tablet with food [provider] Taking Active   clopidogrel (PLAVIX) 75 MG tablet [id number] No Take 1 tablet (75 mg total) by mouth daily. Nahser, Wonda Cheng, MD Taking Active   diclofenac Sodium (VOLTAREN) 1 % GEL [id number] No SMARTSIG:2 Gram(s) Topical 4 Times Daily PRN [provider] Taking Active   famotidine (PEPCID) 40 MG tablet [id number] No Take 40 mg by mouth at bedtime. [provider] Taking Active   isosorbide mononitrate (IMDUR) 30 MG 24 hr tablet [id number] No Take 30 mg by mouth daily. [provider] Taking Active  lisinopril-hydrochlorothiazide (ZESTORETIC) 20-12.5 MG tablet [id number] No Take 2 tablets by mouth daily. Swinyer, Lanice Schwab, NP Taking Active   metFORMIN (GLUCOPHAGE) 500 MG tablet [id number] No Take 1 tablet (500 mg total) by mouth 2 (two) times daily with a meal.  Patient taking differently: Take 1,000 mg by mouth 2 (two) times daily with a meal.   [name], PA-C Taking Active   nitroGLYCERIN (NITROSTAT) 0.4  MG SL tablet [id number] No Place 1 tablet (0.4 mg total) under the tongue every 5 (five) minutes x 3 doses as needed for chest pain. [name], PA-C Taking Active   rOPINIRole (REQUIP) 0.5 MG tablet [id number] No Take 0.25 mg by mouth. [provider] Taking Active   Med List Note Tollie Pizza, CPhT 01/31/21 1311): Marland Kitchen             SDOH:  (Social Determinants of Health) assessments and interventions performed:  SDOH Interventions    Flowsheet Row Most Recent Value  SDOH Interventions   Food Insecurity Interventions Intervention Not Indicated  Financial Strain Interventions Intervention Not Indicated  Transportation Interventions Intervention Not Indicated       Care Plan  Review of patient past medical history, allergies, medications, health status, including review of consultants reports, laboratory and other test data, was performed as part of comprehensive evaluation for care management services.   Care Plan : RN Care Manager Plan of Care  Updates made by Barbaraann Faster, RN since 06/08/2021 12:00 AM     Problem: Complex Care Coordination Needs and disease management in patient with CAD HTN   Priority: High  Onset Date: 04/01/2021     Long-Range Goal: Establish Plan of Care for Management Complex SDOH Barriers, disease management and Care Coordination Needs in patient with CAD, HTN   Start Date: 04/01/2021  This Visit's Progress: On track  Recent Progress: On track  Priority: High  Note:   Current Barriers:  Knowledge Deficits related to plan of care for management of CAD, HTN, and right knee & back pain plus leg swelling  Care Coordination needs related to Limited education about CAD, HTN* Barriers:Health Behaviors Financial Knowledge Other - Lack of pcp 04/01/21 Referred to [facility] on 03/23/21 for assist with establishing a pcp, repeated ED visits Frequent unsuccessful outreaches, engagement, No phone voice mail box, her phone accessibility, work  hours- full time  RN CM Clinical Goal(s):  Patient will work with RN CM  to find a primary care provider for ongoing care of CAD, HTN management  as evidenced by decrease in ED and urgent care visits  through collaboration with RN Care manager, provider, and care team.  06/07/21 Short term goal met - successful outreach, established care with [name], NP on 04/13/21, No admission/ED visits since- October 22-25, 2022- 06/07/21 Updated Goal Patient will demonstrate ongoing self health care management ability of HTN, CAD, right knee & back pain plus leg swelling as evidence by through collaboration with RN CM, provider and care team   Interventions: Further outreaches to patient and medical providers to assist with care coordination and disease management Inter-disciplinary care team collaboration (see longitudinal plan of care) Evaluation of current treatment plan related to  self management and patient's adherence to plan as established by provider   Interdisciplinary Collaboration Interventions:  (Status: Goal Met. 06/07/21) Short Term Goal   Collaborated with [facility] RN CM to initiate plan of care to address needs related to Lacks knowledge of community resource: Need of pcp considering pending change  from [place] to a pending Friday 2023 coverage  in patient with CAD, HTN, and DMII Various calls to engage patient and various calls to find a provider who accepts both coverage and could provide a new patient appointment in January 2023. 04/07/21 secured an appointment for 04/13/21 with [name] [facility])   CAD Interventions: (Status:  Goal on track:  Yes.) Long Term Goal Assessed understanding of CAD diagnosis Medications reviewed including medications utilized in CAD treatment plan Provided education on importance of blood pressure control in management of CAD Reviewed Importance of taking all medications as prescribed Reviewed Importance of attending all scheduled  provider appointments   Hypertension Interventions:  (Status:  Goal on track:  Yes.) Short Term Goal Goal of <130/80  Last practice recorded BP readings:  BP Readings from Last 3 Encounters:  05/25/21 118/60  04/22/21 (!) 152/80  03/23/21 140/78  Most recent eGFR/CrCl:  Lab Results  Component Value Date   EGFR 62 04/01/2021    No components found for: CRCL  Evaluation of current treatment plan related to hypertension self management and patient's adherence to plan as established by provider Reviewed medications with patient and discussed importance of compliance Discussed plans with patient for ongoing care management follow up and provided patient with direct contact information for care management team Discussed the importance of pcp services and attending pcp appointments in the management of HTN    Pain Interventions: (right Knee, back) (Status:  New goal.) Short Term Goal Pain assessment performed Medications reviewed Discussed importance of adherence to all scheduled medical appointments Counseled on the importance of reporting any/all new or changed pain symptoms or management strategies to pain management provider Mailed and e-mailed EMMI for sciatica and active range of motion exercises (Knees and ankles)  Discussed compression hose use Pain management discussed Barrier letter to new pcp with voiced concerns of right knee and back pain mentioned. Also discussed request of assistance with prescription/order for outpatient therapy and compression hose.   Patient Goals/Self-Care Activities: Attend all scheduled provider appointments Follow up on primary care provider choices offered 05/25/21 met with pharmacy staff, Ramond Dial  Follow Up Plan:  The patient has been provided with contact information for the care management team and has been advised to call with any health related questions or concerns.  The care management team will reach out to the patient again over  the next 30+ business days.       Plan: The patient has been provided with contact information for the care management team and has been advised to call with any health related questions or concerns.  The care management team will reach out to the patient again over the next 30+ business days.  Chava Dulac L. Lavina Hamman, RN, BSN, [place] Coordinator Office number [phone] Main [facility] number [phone] Fax number [phone]

## 2021-06-08 ENCOUNTER — Other Ambulatory Visit: Payer: Self-pay | Admitting: *Deleted

## 2021-06-08 DIAGNOSIS — M5430 Sciatica, unspecified side: Secondary | ICD-10-CM | POA: Insufficient documentation

## 2021-06-08 DIAGNOSIS — K219 Gastro-esophageal reflux disease without esophagitis: Secondary | ICD-10-CM | POA: Insufficient documentation

## 2021-06-08 DIAGNOSIS — G8929 Other chronic pain: Secondary | ICD-10-CM | POA: Insufficient documentation

## 2021-06-08 DIAGNOSIS — G2581 Restless legs syndrome: Secondary | ICD-10-CM | POA: Insufficient documentation

## 2021-06-08 DIAGNOSIS — E1142 Type 2 diabetes mellitus with diabetic polyneuropathy: Secondary | ICD-10-CM | POA: Insufficient documentation

## 2021-06-08 DIAGNOSIS — E7800 Pure hypercholesterolemia, unspecified: Secondary | ICD-10-CM | POA: Insufficient documentation

## 2021-06-08 DIAGNOSIS — M5137 Other intervertebral disc degeneration, lumbosacral region: Secondary | ICD-10-CM | POA: Insufficient documentation

## 2021-06-08 NOTE — Patient Outreach (Signed)
[place] [facility] [facility]) Care Management  06/08/2021  Jacqueline Hodges Oct 30, 1965 [id number]   [place] coordination- collaboration with pcp  RN CM outreached to primary care provider (PCP) office and spoke with [name] about patient needs for compression hose and consideration for outpatient therapy for right knee pain Pt on 06/07/21 with c/o daily swelling of her feet and right knee pain with a history of surgery to right knee x 3 (not noted in [facility]) sciatica, degeneration of lumbosacral intervertebral disc, restless leg syndrome she is on her feet constantly  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 NSTEMI 02/2021 with 70% dLAD, 25% mRCA but dLAD felt of uncertain significance, treated medically.   Cervical radiculopathy 01/12/2017   Diabetes mellitus (Hodges)    Fibroid uterus 02/20/2017   Hyperlipidemia LDL goal <70    Hypertension    Migraine with aura and without status migrainosus, not intractable 01/12/2017   Precordial chest pain    Sent further EMMI education via e-mail on restless legg syndrome, degenerative disc disease  Plan [facility] RN CM will follow up with patient within the next 30+ business days  Reiner Loewen L. Lavina Hamman, RN, BSN, [place] Coordinator Office number [phone]

## 2021-06-21 ENCOUNTER — Other Ambulatory Visit: Payer: Self-pay | Admitting: *Deleted

## 2021-06-21 ENCOUNTER — Other Ambulatory Visit: Payer: Self-pay

## 2021-06-21 NOTE — Patient Outreach (Signed)
[place] [facility]) Care Management Telephonic RN Care Manager Note   06/21/2021 Name:  Jacqueline Hodges MRN:  [id number] DOB:  1965/08/07  Summary: Follow up outreach to check to see if patient received calls or interventions for compression hose or outpatient therapy Ms Jacqueline Hodges reports she has not and reports that if the outreaches arrived while she was at work no voice messages were left. She reports she generally is not able to speak on the phone during work hours Knee pain is chronic, ongoing  She discussed a misplaced custom knee brace she had when she relocated from [facility] that helped during the day  Hypertension (HTN) She reports her BP has been within in the last few weeks   Recommendations/Changes made from today's visit: Assessed for update on coordinated needs and possible new needs Inquired about previous knee brace- pt reports it was obtained in [place] not [place]    Subjective: Jacqueline Hodges is an 56 y.o. year old female who is a primary patient of Jacqueline Pilling, NP. The care management team was consulted for assistance with care management and/or care coordination needs.    Telephonic RN Care Manager completed Telephone Visit today.   Objective:  Medications Reviewed Today     Reviewed by Jacqueline Faster, RN (Registered Nurse) on 06/21/21 at 1643  Med List Status: <None>   Medication Order Taking? Sig Documenting Provider Last Dose Status Informant  amLODipine (NORVASC) 10 MG tablet [id number] No Take 1 tablet (10 mg total) by mouth daily. Nahser, Jacqueline Cheng, MD Taking Active   aspirin 81 MG EC tablet [id number] No Take 1 tablet (81 mg total) by mouth daily. Swallow whole. Nahser, Jacqueline Cheng, MD Taking Active   atorvastatin (LIPITOR) 80 MG tablet [id number] No Take 1 tablet (80 mg total) by mouth daily. Nahser, Jacqueline Cheng, MD Taking Active   carvedilol (COREG) 12.5 MG tablet [id number] No Take 1 tablet (12.5 mg total) by mouth 2 (two) times daily. Swinyer, Jacqueline Schwab, NP Taking  Expired 05/20/21 2359   carvedilol (COREG) 25 MG tablet [id number] No 1 tablet with food [provider] Taking Active   clopidogrel (PLAVIX) 75 MG tablet [id number] No Take 1 tablet (75 mg total) by mouth daily. Nahser, Jacqueline Cheng, MD Taking Active   diclofenac Sodium (VOLTAREN) 1 % GEL [id number] No SMARTSIG:2 Gram(s) Topical 4 Times Daily PRN [provider] Taking Active   famotidine (PEPCID) 40 MG tablet [id number] No Take 40 mg by mouth at bedtime. [provider] Taking Active   isosorbide mononitrate (IMDUR) 30 MG 24 hr tablet [id number] No Take 30 mg by mouth daily. [provider] Taking Active   lisinopril-hydrochlorothiazide (ZESTORETIC) 20-12.5 MG tablet [id number] No Take 2 tablets by mouth daily. Swinyer, Jacqueline Schwab, NP Taking Active   metFORMIN (GLUCOPHAGE) 500 MG tablet [id number] No Take 1 tablet (500 mg total) by mouth 2 (two) times daily with a meal.  Patient taking differently: Take 1,000 mg by mouth 2 (two) times daily with a meal.   Jacqueline Mclean, PA-C Taking Active   nitroGLYCERIN (NITROSTAT) 0.4 MG SL tablet [id number] No Place 1 tablet (0.4 mg total) under the tongue every 5 (five) minutes x 3 doses as needed for chest pain. Jacqueline Mclean, PA-C Taking Active   rOPINIRole (REQUIP) 0.5 MG tablet [id number] No Take 0.25 mg by mouth. [provider] Taking Active   Med List Note Tollie Pizza, CPhT 01/31/21 1311): Jacqueline Hodges  SDOH:  (Social Determinants of Health) assessments and interventions performed:    Care Plan  Review of patient past medical history, allergies, medications, health status, including review of consultants reports, laboratory and other test data, was performed as part of comprehensive evaluation for care management services.   Care Plan : RN Care Manager Plan of Care  Updates made by Jacqueline Faster, RN since 06/21/2021 12:00 AM     Problem: Complex Care Coordination Needs and disease  management in patient with CAD HTN   Priority: High  Onset Date: 04/01/2021     Long-Range Goal: Establish Plan of Care for Management Complex SDOH Barriers, disease management and Care Coordination Needs in patient with CAD, HTN   Start Date: 04/01/2021  This Visit's Progress: On track  Recent Progress: On track  Priority: High  Note:   Current Barriers:  Knowledge Deficits related to plan of care for management of CAD, HTN, and right knee & back pain plus leg swelling  Care Coordination needs related to Limited education about CAD, HTN* Barriers:Health Behaviors Financial Knowledge Other - Lack of pcp 04/01/21 Referred to [facility] on 03/23/21 for assist with establishing a pcp, repeated ED visits Frequent unsuccessful outreaches, engagement, No phone voice mail box, her phone accessibility, work hours- full time  RN CM Clinical Goal(s):  Patient will work with RN CM  to find a primary care provider for ongoing care of CAD, HTN management  as evidenced by decrease in ED and urgent care visits  through collaboration with RN Care manager, provider, and care team.  06/07/21 Short term goal met - successful outreach, established care with Jacqueline Pilling, NP on 04/13/21, No admission/ED visits since- October 22-25, 2022- 06/07/21 Updated Goal Patient will demonstrate ongoing self health care management ability of HTN, CAD, right knee & back pain plus leg swelling as evidence by through collaboration with RN CM, provider and care team   Interventions: Further outreaches to patient and medical providers to assist with care coordination and disease management Inter-disciplinary care team collaboration (see longitudinal plan of care) Evaluation of current treatment plan related to  self management and patient's adherence to plan as established by provider   Interdisciplinary Collaboration Interventions:  (Status: Goal Met. 06/07/21) Short Term Goal   Collaborated with [facility] RN CM to initiate plan of care  to address needs related to Lacks knowledge of community resource: Need of pcp considering pending change from [place] to a pending Friday 2023 coverage  in patient with CAD, HTN, and DMII Various calls to engage patient and various calls to find a provider who accepts both coverage and could provide a new patient appointment in January 2023. 04/07/21 secured an appointment for 04/13/21 with [facility])   CAD Interventions: (Status:  Goal on track:  Yes.) Long Term Goal Assessed understanding of CAD diagnosis Medications reviewed including medications utilized in CAD treatment plan Provided education on importance of blood pressure control in management of CAD Reviewed Importance of taking all medications as prescribed Reviewed Importance of attending all scheduled provider appointments   Hypertension Interventions:  (Status:  Goal on track:  Yes.) Short Term Goal 06/21/21 She reports her BP has been within in the last few weeks Goal of <130/80  Last practice recorded BP readings:  BP Readings from Last 3 Encounters:  05/25/21 118/60  04/22/21 (!) 152/80  03/23/21 140/78  Most recent eGFR/CrCl:  Lab Results  Component Value Date   EGFR 62 04/01/2021  No components found for: CRCL  Evaluation of current treatment plan related to hypertension self management and patient's adherence to plan as established by provider Reviewed medications with patient and discussed importance of compliance Discussed plans with patient for ongoing care management follow up and provided patient with direct contact information for care management team Discussed the importance of pcp services and attending pcp appointments in the management of HTN    Chronic Pain Interventions: (right Knee, back) (Status:  Goal on track:  Yes.) Short Term Goal Pain assessment performed Medications reviewed Discussed importance of adherence to all scheduled medical  appointments Counseled on the importance of reporting any/all new or changed pain symptoms or management strategies to pain management provider Mailed and e-mailed EMMI for sciatica and active range of motion exercises (Knees and ankles)  Discussed compression hose use Pain management discussed Barrier letter to new pcp with voiced concerns of right knee and back pain mentioned. Also discussed request of assistance with prescription/order for outpatient therapy and compression hose. 06/21/21 Assessed for update on coordinated needs and possible new needs Inquired about previous knee brace- pt reports it was obtained in [facility] not [place]   Patient Goals/Self-Care Activities: Attend all scheduled provider appointments Follow up on primary care provider choices offered 05/25/21 met with pharmacy staff, Ramond Dial  Follow Up Plan:  The patient has been provided with contact information for the care management team and has been advised to call with any health related questions or concerns.  The care management team will reach out to the patient again over the next 30+ business days.       Plan: The patient has been provided with contact information for the care management team and has been advised to call with any health related questions or concerns.  The care management team will reach out to the patient again over the next 30+ business  days.  Harvie Morua L. Lavina Hamman, RN, BSN, [place] Coordinator Office number [phone] Main [facility] number [phone] Fax number [phone]

## 2021-07-12 ENCOUNTER — Other Ambulatory Visit: Payer: Self-pay | Admitting: *Deleted

## 2021-07-12 NOTE — Patient Outreach (Signed)
[facility]) Care Management ?Telephonic RN Care Manager Note ? ? ?07/14/2021 ?Name:  Jacqueline Hodges MRN:  [id number] DOB:  [birth year] ? ?Summary: ?Unsuccessful outreach to [phone]  ?Patient's daughter answered and reported the patient had stepped out. [facility] RN CM left HIPAA [facility] Portability and Accountability Act) compliant voicemail message along with CM's contact info at the preferred number indicated in EPIC  ?Requested she update patient that RN CM spoke with her pcp office about compression hose and right knee brace  ? ?Recommendations/Changes made from today's visit: ?Spoke with Kieth Brightly at [phone] to leave a message to inquire about assistance for compression hose and a knee brace for patient Left RN CM's outreach number  ?Tiffany from pcp office returned a call to [place] [phone]  ?RN CM and Tiffany discussed pt voiced concerns with her right knee, previous right knee brace, compression hose and therapies.  Tiffany confirms patient needs to complete an office visit for evaluation/treatment prior to a possible referral to orthopedic. Last pcp office visit on 05/20/21 for establishing care, post hospital and back pain.  ?Will discuss with pt with a follow up outreach ? ?Subjective: ?Jacqueline Hodges is an 56 y.o. year old female who is a primary patient of [name], NP. The care management team was consulted for assistance with care management and/or care coordination needs.   ? ?Telephonic RN Care Manager completed Telephone Visit today.  ? ?Objective: ? ?Medications Reviewed Today   ? ? Reviewed by Barbaraann Faster, RN (Registered Nurse) on 06/21/21 at 1643  Med List Status: <None>  ? ?Medication Order Taking? Sig Documenting Provider Last Dose Status Informant  ?amLODipine (NORVASC) 10 MG tablet [id number] No Take 1 tablet (10 mg total) by mouth daily. Nahser, Wonda Cheng, MD Taking Active   ?aspirin 81 MG EC tablet [id number] No Take 1 tablet (81 mg total) by mouth daily.  Swallow whole. Nahser, Wonda Cheng, MD Taking Active   ?atorvastatin (LIPITOR) 80 MG tablet [id number] No Take 1 tablet (80 mg total) by mouth daily. Nahser, Wonda Cheng, MD Taking Active   ?carvedilol (COREG) 12.5 MG tablet [id number] No Take 1 tablet (12.5 mg total) by mouth 2 (two) times daily. Emmaline Life, NP Taking Expired 05/20/21 2359   ?carvedilol (COREG) 25 MG tablet [id number] No 1 tablet with food [provider] Taking Active   ?clopidogrel (PLAVIX) 75 MG tablet [id number] No Take 1 tablet (75 mg total) by mouth daily. Nahser, Wonda Cheng, MD Taking Active   ?diclofenac Sodium (VOLTAREN) 1 % GEL [id number] No SMARTSIG:2 Gram(s) Topical 4 Times Daily PRN [provider] Taking Active   ?famotidine (PEPCID) 40 MG tablet [id number] No Take 40 mg by mouth at bedtime. [provider] Taking Active   ?isosorbide mononitrate (IMDUR) 30 MG 24 hr tablet [id number] No Take 30 mg by mouth daily. [provider] Taking Active   ?lisinopril-hydrochlorothiazide (ZESTORETIC) 20-12.5 MG tablet [id number] No Take 2 tablets by mouth daily. Swinyer, Lanice Schwab, NP Taking Active   ?metFORMIN (GLUCOPHAGE) 500 MG tablet [id number] No Take 1 tablet (500 mg total) by mouth 2 (two) times daily with a meal.  ?Patient taking differently: Take 1,000 mg by mouth 2 (two) times daily with a meal.  ? [name], PA-C Taking Active   ?nitroGLYCERIN (NITROSTAT) 0.4 MG SL tablet [id number] No Place 1 tablet (0.4 mg total) under the tongue every 5 (five) minutes x 3 doses as needed for chest pain.  [name], PA-C Taking Active   ?rOPINIRole (REQUIP) 0.5 MG tablet [id number] No Take 0.25 mg by mouth. [provider] Taking Active   ?Med List Note Tollie Pizza, CPhT 01/31/21 1311): .  ? ?  ?  ? ?  ? ? ? ?SDOH:  (Social Determinants of Health) assessments and interventions performed:  ? ? ?Care Plan ? ?Review of patient past medical history, allergies, medications, health status, including  review of consultants reports, laboratory and other test data, was performed as part of comprehensive evaluation for care management services.  ? ?Care Plan : RN Care Manager Plan of Care  ?Updates made by Barbaraann Faster, RN since 07/14/2021 12:00 AM  ?  ? ?Problem: Complex Care Coordination Needs and disease management in patient with CAD HTN   ?Priority: High  ?Onset Date: 04/01/2021  ?  ? ?Long-Range Goal: Establish Plan of Care for Management Complex SDOH Barriers, disease management and Care Coordination Needs in patient with CAD, HTN   ?Start Date: 04/01/2021  ?Recent Progress: On track  ?Priority: High  ?Note:   ?Current Barriers:  ?Knowledge Deficits related to plan of care for management of CAD, HTN, and right knee & back pain plus leg swelling  ?Care Coordination needs related to Limited education about CAD, HTN* ?Barriers:Health Behaviors ?Financial ?Knowledge ?Other - Lack of pcp 04/01/21 ?Referred to [facility] on 03/23/21 for assist with establishing a pcp, repeated ED visits ?Frequent unsuccessful outreaches, engagement, No phone voice mail box, her phone accessibility, work hours- full time ?Unsuccessful outreach 07/13/21 ? ?RN CM Clinical Goal(s):  ?Patient will work with RN CM  to find a primary care provider for ongoing care of CAD, HTN management  as evidenced by decrease in ED and urgent care visits  through collaboration with RN Care manager, provider, and care team.  ?06/07/21 Short term goal met - successful outreach, established care with [name], NP on 04/13/21, No admission/ED visits since- October 22-25, 2022- ?06/07/21 Updated Goal Patient will demonstrate ongoing self health care management ability of HTN, CAD, right knee & back pain plus leg swelling as evidence by through collaboration with RN CM, provider and care team ? ? ?Interventions: ?Further outreaches to patient and medical providers to assist with care coordination and disease management ?Inter-disciplinary care team collaboration  (see longitudinal plan of care) ?Evaluation of current treatment plan related to  self management and patient's adherence to plan as established by provider ? ? ?Interdisciplinary Collaboration Interventions:  (Status: Goal Met. 06/07/21) Short Term Goal   ?Collaborated with [facility] RN CM to initiate plan of care to address needs related to Lacks knowledge of community resource: Need of pcp considering pending change from [place] to a pending Friday 2023 coverage  in patient with CAD, HTN, and DMII ?Various calls to engage patient and various calls to find a provider who accepts both coverage and could provide a new patient appointment in January 2023. 04/07/21 secured an appointment for 04/13/21 with [name] [facility]) ? ? ?CAD Interventions: (Status:  Goal on track:  Yes.) Long Term Goal ?Assessed understanding of CAD diagnosis ?Medications reviewed including medications utilized in CAD treatment plan ?Provided education on importance of blood pressure control in management of CAD ?Reviewed Importance of taking all medications as prescribed ?Reviewed Importance of attending all scheduled provider appointments ? ? ?Hypertension Interventions:  (Status:  Goal on track:  Yes.) Short Term Goal 06/21/21 She reports her BP has been within in the last few  weeks ?Goal of <130/80  ?Last practice recorded BP readings:  ?BP Readings from Last 3 Encounters:  ?05/25/21 118/60  ?04/22/21 (!) 152/80  ?03/23/21 140/78  ?Most recent eGFR/CrCl:  ?Lab Results  ?Component Value Date  ? EGFR 62 04/01/2021  ?  No components found for: CRCL ? ?Evaluation of current treatment plan related to hypertension self management and patient's adherence to plan as established by provider ?Reviewed medications with patient and discussed importance of compliance ?Discussed plans with patient for ongoing care management follow up and provided patient with direct contact information for care management team ?Discussed the  importance of pcp services and attending pcp appointments in the management of HTN  ? ? ?Chronic Pain Interventions: (right Knee, back) (Status:  Goal on track:  Yes.) Short Term Goal ?Pain assessment perfor

## 2021-07-15 ENCOUNTER — Telehealth: Payer: Self-pay | Admitting: Pharmacist

## 2021-07-15 MED ORDER — CARVEDILOL 25 MG PO TABS: 25.0000 mg | ORAL_TABLET | Freq: Two times a day (BID) | ORAL | 3 refills | Status: DC

## 2021-07-15 NOTE — Telephone Encounter (Signed)
Patient returned call. She has had a cold and cough/headaches for the last few weeks. Sounds more like allergies. Has been taking nyquil cough/cold/flu nightime. Does not look like their is any decongestant in it. Recommended clartin or zyrtec and flonase. ?BP has been up and down ?053-976'B systolic ?HR 79-80's ?Will increase carvedilol to '25mg'$  BID. F/u in clinic in 2 weeks. ?

## 2021-07-15 NOTE — Telephone Encounter (Signed)
Called patient to see how her blood pressure was doing. No answer and VM full. Will try again later ?

## 2021-07-26 ENCOUNTER — Other Ambulatory Visit: Payer: Self-pay | Admitting: *Deleted

## 2021-07-26 NOTE — Patient Outreach (Signed)
[facility]) Care Management ?Telephonic RN Care Manager Note ? ? ?07/26/2021 ?Name:  Jacqueline Hodges MRN:  [id number] DOB:  1966/01/30 ? ?Summary: ?Unsuccessful outreach to [phone]  ?No answer. Voice mail box is full. [facility] RN CM unable to leave a HIPAA ([place]) compliant voicemail message along with CM's contact info at the preferred number indicated in EPIC  ? ? ?Subjective: ?Jacqueline Hodges is an 56 y.o. year old female who is a primary patient of [name], NP. The care management team was consulted for assistance with care management and/or care coordination needs.   ? ?Telephonic RN Care Manager completed Telephone Visit today.  ? ?Objective: ? ?Medications Reviewed Today   ? ? Reviewed by Barbaraann Faster, RN (Registered Nurse) on 06/21/21 at 1643  Med List Status: <None>  ? ?Medication Order Taking? Sig Documenting Provider Last Dose Status Informant  ?amLODipine (NORVASC) 10 MG tablet [id number] No Take 1 tablet (10 mg total) by mouth daily. Nahser, Wonda Cheng, MD Taking Active   ?aspirin 81 MG EC tablet [id number] No Take 1 tablet (81 mg total) by mouth daily. Swallow whole. Nahser, Wonda Cheng, MD Taking Active   ?atorvastatin (LIPITOR) 80 MG tablet [id number] No Take 1 tablet (80 mg total) by mouth daily. Nahser, Wonda Cheng, MD Taking Active   ?carvedilol (COREG) 12.5 MG tablet [id number] No Take 1 tablet (12.5 mg total) by mouth 2 (two) times daily. Swinyer, Lanice Schwab, NP Taking Active   ?carvedilol (COREG) 25 MG tablet [id number] No 1 tablet with food [provider] Taking Active   ?clopidogrel (PLAVIX) 75 MG tablet [id number] No Take 1 tablet (75 mg total) by mouth daily. Nahser, Wonda Cheng, MD Taking Active   ?diclofenac Sodium (VOLTAREN) 1 % GEL [id number] No SMARTSIG:2 Gram(s) Topical 4 Times Daily PRN [provider] Taking Active   ?famotidine (PEPCID) 40 MG tablet [id number] No Take 40 mg by mouth at bedtime. [provider] Taking Active   ?isosorbide mononitrate (IMDUR) 30 MG 24 hr tablet [id number] No Take 30 mg by mouth daily. [provider] Taking Active   ?lisinopril-hydrochlorothiazide (ZESTORETIC) 20-12.5 MG tablet [id number] No Take 2 tablets by mouth daily. Swinyer, Lanice Schwab, NP Taking Active   ?metFORMIN (GLUCOPHAGE) 500 MG tablet [id number] No Take 1 tablet (500 mg total) by mouth 2 (two) times daily with a meal.  ?Patient taking differently: Take 1,000 mg by mouth 2 (two) times daily with a meal.  ? [name], PA-C Taking Active   ?nitroGLYCERIN (NITROSTAT) 0.4 MG SL tablet [id number] No Place 1 tablet (0.4 mg total) under the tongue every 5 (five) minutes x 3 doses as needed for chest pain. [name], PA-C Taking Active   ?rOPINIRole (REQUIP) 0.5 MG tablet [id number] No Take 0.25 mg by mouth. [provider] Taking Active   ?Med List Note Tollie Pizza, CPhT 01/31/21 1311): .  ? ?  ?  ? ?  ? ? ? ?SDOH:  (Social Determinants of Health) assessments and interventions performed:  ? ? ?Care Plan ? ?Review of patient past medical history, allergies, medications, health status, including review of consultants reports, laboratory and other test data, was performed as part of comprehensive evaluation for care management services.  ? ?There are no care plans that you recently modified to display for this patient. ?  ? ?Plan: The patient has been provided with contact information for the care management team and has been advised to  call with any health related questions or concerns.  ?The care management team will reach out to the patient again over the next 30+ business days ?Unsuccessful outreach letter mailed  ? ?Blu Lori L. Lavina Hamman, RN, BSN, CCM ?[place] Management Care Coordinator ?Office number [phone] ?[facility] number [phone] ?Fax number [phone] ? ? ? ? ? ? ? ?

## 2021-07-28 NOTE — Progress Notes (Unsigned)
Patient ID: Jacqueline Hodges                 DOB: 15-Sep-1965                      MRN: [id number] ? ? ? ? ?HPI: ?Jacqueline Hodges is a 56 y.o. female patient of Dr. Acie Fredrickson referred by Christen Bame to HTN clinic. PMH is significant for chest pain, NSTEMI, CAD with moderate stenosis in distal LAD, DM, hyperlipidemia, migraine, and HTN. Lisinopril was increased to '40mg'$  at last visit with [facility]. Imdur was stopped due to rash (however resumed by patient because she decided rash was not from isosorbide). ? ?At Jan 2023 PharmD visit, blood pressure was 152/80. Amlodipine was increased to '10mg'$  daily. A month later, home readings were 130s/70s and BP in clinic was 118/60. No changes made. Patient endorsed labile BP with systolic readings in 182-99B over the phone in early April. Carvedilol increased to 25 mg twice daily. ? ?Patient presents today to PharmD clinic.  ?***Denies balance issues, lightheadedness. History of chronic headaches. ? ?Sx since increasing Coreg to 25 mg BID ?Med review ?How have headaches been? ?Home readings/cuff? ?Review technique ?Options ?BMET > add spiro 12.5 mg (03/2021 - SCr 1.06, K 4.0) ?Could switch to chlorthalidone 25 mg daily - would do that before alpha blocker/hydral ? ?Current HTN meds:  ?Lisinopril/HCTZ 40/'25mg'$  ?Amlodipine '10mg'$  daily ?Carvedilol '25mg'$  twice a day ?Isosorbide '30mg'$  daily ? ?BP goal: <130/80 ? ?Family History: The patient's family history includes Coronary artery disease in her mother; Diabetes in her mother; Heart failure in her mother; High Cholesterol in her mother. ? ?Social History: never smoked, no alcohol ? ?Diet: ***some soda, some tea, occassionally coffee, drinks cranberry juice  ? ?Exercise: ***works in [employer], is active at home with her grand kids  ? ?Home BP readings: mostly in the 130's /78-79 1 high reading 716 systolic ? ?Wt Readings from Last 3 Encounters:  ?03/23/21 197 lb 9.6 oz (89.6 kg)  ?03/01/21 199 lb 9.6 oz (90.5 kg)  ?02/19/21 197 lb  (89.4 kg)  ? ?BP Readings from Last 3 Encounters:  ?05/25/21 118/60  ?04/22/21 (!) 152/80  ?03/23/21 140/78  ? ?Pulse Readings from Last 3 Encounters:  ?05/25/21 76  ?04/22/21 81  ?03/23/21 78  ? ? ?Renal function: ?CrCl cannot be calculated (Patient's most recent lab result is older than the maximum 21 days allowed.). ? ?Past Medical History:  ?Diagnosis Date  ? Abnormal perimenopausal bleeding 02/20/2017  ? CAD in native artery   ? a. NSTEMI 02/2021 with 70% dLAD, 25% mRCA but dLAD felt of uncertain significance, treated medically.  ? Cervical radiculopathy 01/12/2017  ? Diabetes mellitus ([place])   ? Fibroid uterus 02/20/2017  ? Hyperlipidemia LDL goal <70   ? Hypertension   ? Migraine with aura and without status migrainosus, not intractable 01/12/2017  ? Precordial chest pain   ? ? ?Current Outpatient Medications on File Prior to Visit  ?Medication Sig Dispense Refill  ? amLODipine (NORVASC) 10 MG tablet Take 1 tablet (10 mg total) by mouth daily. 90 tablet 3  ? aspirin 81 MG EC tablet Take 1 tablet (81 mg total) by mouth daily. Swallow whole. 90 tablet 3  ? atorvastatin (LIPITOR) 80 MG tablet Take 1 tablet (80 mg total) by mouth daily. 90 tablet 3  ? carvedilol (COREG) 25 MG tablet Take 1 tablet (25 mg total) by mouth 2 (two) times daily. 180 tablet 3  ? clopidogrel (PLAVIX)  75 MG tablet Take 1 tablet (75 mg total) by mouth daily. 90 tablet 3  ? diclofenac Sodium (VOLTAREN) 1 % GEL SMARTSIG:2 Gram(s) Topical 4 Times Daily PRN    ? famotidine (PEPCID) 40 MG tablet Take 40 mg by mouth at bedtime.    ? isosorbide mononitrate (IMDUR) 30 MG 24 hr tablet Take 30 mg by mouth daily.    ? lisinopril-hydrochlorothiazide (ZESTORETIC) 20-12.5 MG tablet Take 2 tablets by mouth daily. 60 tablet 11  ? metFORMIN (GLUCOPHAGE) 500 MG tablet Take 1 tablet (500 mg total) by mouth 2 (two) times daily with a meal. (Patient taking differently: Take 1,000 mg by mouth 2 (two) times daily with a meal.) 60 tablet 2  ? nitroGLYCERIN  (NITROSTAT) 0.4 MG SL tablet Place 1 tablet (0.4 mg total) under the tongue every 5 (five) minutes x 3 doses as needed for chest pain. 25 tablet 2  ? rOPINIRole (REQUIP) 0.5 MG tablet Take 0.25 mg by mouth.    ? ?No current facility-administered medications on file prior to visit.  ? ? ?No Known Allergies ? ?Last menstrual period 07/11/2017. ? ? ?Assessment/Plan: ? ?1. Hypertension - Blood pressure at goal of <130/80 in clinic. Patient reports adherence with medications. Blood pressure much improved. Home blood pressure just slightly above goal. Continue lisinopril/HCTZ 40/'25mg'$ , amlodipine '10mg'$  daily, carvedilol '25mg'$  twice a day, isosorbide '30mg'$  daily. I will call patient in 1 month to follow up. ? ?Thank you ? ?Ramond Dial, Pharm.D, BCPS, CPP ?[place] [facility] [place], [place] [address]  ?Phone: [phone]; Fax: [phone]  ? ? ?

## 2021-07-29 ENCOUNTER — Ambulatory Visit: Payer: 59

## 2021-08-19 ENCOUNTER — Other Ambulatory Visit: Payer: Self-pay | Admitting: *Deleted

## 2021-08-19 NOTE — Patient Outreach (Signed)
[facility]queline Hodges) Care Management ?Telephonic RN Care Manager Note ? ? ?08/19/2021 ?Name:  Jacqueline Hodges MRN:  [id number] DOB:  23-May-1965 ? ?Summary: ?Unsuccessful outreach to [phone]  ?No answer The call could not be completed as dialed x 3  ? ?Subjective: ?Jacqueline Hodges is an 56 y.o. year old female who is a primary patient of [name], NP. The care management team was consulted for assistance with care management and/or care coordination needs.   ? ?Telephonic RN Care Manager completed Telephone Visit today.  ? ?Objective: ? ?Medications Reviewed Today   ? ? Reviewed by Barbaraann Faster, RN (Registered Nurse) on 06/21/21 at 1643  Med List Status: <None>  ? ?Medication Order Taking? Sig Documenting Provider Last Dose Status Informant  ?amLODipine (NORVASC) 10 MG tablet [id number] No Take 1 tablet (10 mg total) by mouth daily. Nahser, Wonda Cheng, MD Taking Active   ?aspirin 81 MG EC tablet [id number] No Take 1 tablet (81 mg total) by mouth daily. Swallow whole. Nahser, Wonda Cheng, MD Taking Active   ?atorvastatin (LIPITOR) 80 MG tablet [id number] No Take 1 tablet (80 mg total) by mouth daily. Nahser, Wonda Cheng, MD Taking Active   ?carvedilol (COREG) 12.5 MG tablet [id number] No Take 1 tablet (12.5 mg total) by mouth 2 (two) times daily. Swinyer, Lanice Schwab, NP Taking Active   ?carvedilol (COREG) 25 MG tablet [id number] No 1 tablet with food [provider] Taking Active   ?clopidogrel (PLAVIX) 75 MG tablet [id number] No Take 1 tablet (75 mg total) by mouth daily. Nahser, Wonda Cheng, MD Taking Active   ?diclofenac Sodium (VOLTAREN) 1 % GEL [id number] No SMARTSIG:2 Gram(s) Topical 4 Times Daily PRN [provider] Taking Active   ?famotidine (PEPCID) 40 MG tablet [id number] No Take 40 mg by mouth at bedtime. [provider] Taking Active   ?isosorbide mononitrate (IMDUR) 30 MG 24 hr tablet [id number] No Take 30 mg by mouth daily. [provider] Taking Active    ?lisinopril-hydrochlorothiazide (ZESTORETIC) 20-12.5 MG tablet [id number] No Take 2 tablets by mouth daily. Swinyer, Lanice Schwab, NP Taking Active   ?metFORMIN (GLUCOPHAGE) 500 MG tablet [id number] No Take 1 tablet (500 mg total) by mouth 2 (two) times daily with a meal.  ?Patient taking differently: Take 1,000 mg by mouth 2 (two) times daily with a meal.  ? [name], PA-C Taking Active   ?nitroGLYCERIN (NITROSTAT) 0.4 MG SL tablet [id number] No Place 1 tablet (0.4 mg total) under the tongue every 5 (five) minutes x 3 doses as needed for chest pain. [name], PA-C Taking Active   ?rOPINIRole (REQUIP) 0.5 MG tablet [id number] No Take 0.25 mg by mouth. [provider] Taking Active   ?Med List Note Tollie Pizza, CPhT 01/31/21 1311): .  ? ?  ?  ? ?  ? ? ? ?SDOH:  (Social Determinants of Health) assessments and interventions performed:  ? ? ?Care Plan ? ?Review of patient past medical history, allergies, medications, health status, including review of consultants reports, laboratory and other test data, was performed as part of comprehensive evaluation for care management services.  ? ?There are no care plans that you recently modified to display for this patient. ?  ? ?Plan: Pend patient for case closure within the next 10 business days if no response from pt letter or attempted outreaches ?The patient has been provided with contact information for the care management team and has been advised to call with any health related  questions or concerns.  ? ?Salisha Bardsley L. Lavina Hamman, RN, BSN, CCM ?[place] Management Care Coordinator ?Office number [phone] ?Main [facility] number [phone] ?Fax number [phone] ? ? ? ? ? ?

## 2021-09-15 ENCOUNTER — Other Ambulatory Visit: Payer: Self-pay | Admitting: *Deleted

## 2021-09-15 NOTE — Patient Outreach (Signed)
[facility]) Care Management  09/15/2021  Jacqueline Hodges 09-15-1965 [id number]   [facility] Case closure - unable to maintain contact  Call attempts made on 07/12/21 07/26/21 and 08/19/21 Unsuccessful outreach letter sent on 07/26/21 without a response   Plan [facility] RN CM will close case after no response from patient within 10+ business days. Unable to maintain contact Case closure letters sent to patient and MD   Care Plan : [place] of Care  Updates made by Barbaraann Faster, RN since 09/15/2021 12:00 AM  Completed 09/15/2021   Problem: Complex Care Coordination Needs and disease management in patient with CAD HTN Resolved 09/15/2021  Priority: High  Onset Date: 04/01/2021     Long-Range Goal: Establish Plan of Care for Management Complex SDOH Barriers, disease management and Care Coordination Needs in patient with CAD, HTN Completed 09/15/2021  Start Date: 04/01/2021  Recent Progress: On track  Priority: High  Note:   Current Barriers:  Knowledge Deficits related to plan of care for management of CAD, HTN, and right knee & back pain plus leg swelling  Care Coordination needs related to Limited education about CAD, HTN* Barriers:Health Behaviors Financial Knowledge Other - Lack of pcp 04/01/21 Referred to [facility] on 03/23/21 for assist with establishing a pcp, repeated ED visits Frequent unsuccessful outreaches, engagement, No phone voice mail box, her phone accessibility, work hours- full time Unsuccessful outreach 07/12/21, 07/26/21 & 08/19/21 Unsuccessful outreach letter sent on 07/26/21 without a response  RN CM Clinical Goal(s):  Patient will work with RN CM  to find a primary care provider for ongoing care of CAD, HTN management  as evidenced by decrease in ED and urgent care visits  through collaboration with RN Care manager, provider, and care team.  06/07/21 Short term goal met - successful outreach, established care with [name], NP on 04/13/21, No admission/ED  visits since- October 22-25, 2022- 06/07/21 Updated Goal Patient will demonstrate ongoing self health care management ability of HTN, CAD, right knee & back pain plus leg swelling as evidence by through collaboration with RN CM, provider and care team   Interventions: Further outreaches to patient and medical providers to assist with care coordination and disease management Inter-disciplinary care team collaboration (see longitudinal plan of care) Evaluation of current treatment plan related to  self management and patient's adherence to plan as established by provider   Interdisciplinary Collaboration Interventions:  (Status: Goal Met. 06/07/21) Short Term Goal   Collaborated with [facility] RN CM to initiate plan of care to address needs related to Lacks knowledge of community resource: Need of pcp considering pending change from [facility] [facility] to a pending Friday 2023 coverage  in patient with CAD, HTN, and DMII Various calls to engage patient and various calls to find a provider who accepts both coverage and could provide a new patient appointment in January 2023. 04/07/21 secured an appointment for 04/13/21 with [name] [facility])   CAD Interventions: (Status:  Patient declined further engagement on this goal.) Long Term Goal Assessed understanding of CAD diagnosis Medications reviewed including medications utilized in CAD treatment plan Provided education on importance of blood pressure control in management of CAD Reviewed Importance of taking all medications as prescribed Reviewed Importance of attending all scheduled provider appointments   Hypertension Interventions:  (Status:  Patient declined further engagement on this goal.) Short Term Goal 06/21/21 She reports her BP has been within in the last few weeks Goal of <130/80  Last practice  recorded BP readings:  BP Readings from Last 3 Encounters:  05/25/21 118/60  04/22/21 (!) 152/80  03/23/21 140/78  Most  recent eGFR/CrCl:  Lab Results  Component Value Date   EGFR 62 04/01/2021    No components found for: CRCL  Evaluation of current treatment plan related to hypertension self management and patient's adherence to plan as established by provider Reviewed medications with patient and discussed importance of compliance Discussed plans with patient for ongoing care management follow up and provided patient with direct contact information for care management team Discussed the importance of pcp services and attending pcp appointments in the management of HTN    Chronic Pain Interventions: (right Knee, back) (Status:  Patient declined further engagement on this goal.) Short Term Goal Pain assessment performed Medications reviewed Discussed importance of adherence to all scheduled medical appointments Counseled on the importance of reporting any/all new or changed pain symptoms or management strategies to pain management provider Mailed and e-mailed EMMI for sciatica and active range of motion exercises (Knees and ankles)  Discussed compression hose use Pain management discussed Barrier letter to new pcp with voiced concerns of right knee and back pain mentioned. Also discussed request of assistance with prescription/order for outpatient therapy and compression hose. 06/21/21 Assessed for update on coordinated needs and possible new needs Inquired about previous knee brace- pt reports it was obtained in [facility] not [facility] 07/12/21 collaboration with pcp office staff Tiffany about pt compression hose and right knee brace Tiffany confirms patient needs to complete an office visit for evaluation/treatment prior to a possible referral to orthopedic. Last pcp office visit on 05/20/21 for establishing care, post hospital and back pain.  Will discuss with pt with next successful follow up outreach    Patient Goals/Self-Care Activities: Attend all scheduled provider appointments Follow up on primary care provider  choices offered 05/25/21 met with pharmacy staff, Ramond Dial  Follow Up Plan:  The patient has been provided with contact information for the care management team and has been advised to call with any health related questions or concerns.  No further follow up required: [facility] RN CM will close case after no response from patient within 10+ business days Unable to maintain contact.        Jamoni Hewes L. Lavina Hamman, RN, BSN, [place] Coordinator Office number [phone] [facility] number [phone] Fax number [phone]

## 2021-10-19 ENCOUNTER — Other Ambulatory Visit: Payer: Self-pay

## 2021-12-08 ENCOUNTER — Encounter: Payer: Self-pay | Admitting: Interventional Cardiology

## 2021-12-08 ENCOUNTER — Ambulatory Visit: Payer: Commercial Managed Care - HMO | Attending: Interventional Cardiology | Admitting: Interventional Cardiology

## 2021-12-08 VITALS — BP 140/80 | HR 84 | Ht 65.0 in | Wt 206.0 lb

## 2021-12-08 DIAGNOSIS — I25118 Atherosclerotic heart disease of native coronary artery with other forms of angina pectoris: Secondary | ICD-10-CM | POA: Diagnosis not present

## 2021-12-08 DIAGNOSIS — E669 Obesity, unspecified: Secondary | ICD-10-CM

## 2021-12-08 DIAGNOSIS — E66811 Obesity, class 1: Secondary | ICD-10-CM

## 2021-12-08 DIAGNOSIS — I10 Essential (primary) hypertension: Secondary | ICD-10-CM | POA: Diagnosis not present

## 2021-12-08 MED ORDER — ISOSORBIDE MONONITRATE ER 30 MG PO TB24: 30.0000 mg | ORAL_TABLET | Freq: Every day | ORAL | 3 refills | Status: DC

## 2021-12-08 NOTE — Patient Instructions (Signed)
Medication Instructions:  Your physician has recommended you make the following change in your medication: Resume isosorbide mononitrate 30 mg by mouth daily  *If you need a refill on your cardiac medications before your next appointment, please call your pharmacy*   Lab Work: none If you have labs (blood work) drawn today and your tests are completely normal, you will receive your results only by: [place] (if you have MyChart) OR A paper copy in the mail If you have any lab test that is abnormal or we need to change your treatment, we will call you to review the results.   Testing/Procedures: none   Follow-Up: At [facility], you and your health needs are our priority.  As part of our continuing mission to provide you with exceptional heart care, we have created designated Provider Care Teams.  These Care Teams include your primary Cardiologist (physician) and Advanced Practice Providers (APPs -  Physician Assistants and Nurse Practitioners) who all work together to provide you with the care you need, when you need it.  We recommend signing up for the patient portal called "MyChart".  Sign up information is provided on this After Visit Summary.  MyChart is used to connect with patients for Virtual Visits (Telemedicine).  Patients are able to view lab/test results, encounter notes, upcoming appointments, etc.  Non-urgent messages can be sent to your provider as well.   To learn more about what you can do with MyChart, go to [email or web address].    Your next appointment:   2-3 month(s)  The format for your next appointment:   In Person  Provider:   Mertie Moores, MD     Other Instructions   Dr Irish Lack talked with you about mindfulness based stress reduction.  Information can be found on [place] of medicine website.   Important Information About Sugar

## 2021-12-08 NOTE — Progress Notes (Signed)
Cardiology Office Note   Date:  12/08/2021   ID:  Jacqueline Hodges, Jacqueline Hodges [birth year], MRN [id number]  PCP:  Jacqueline Pilling, NP    No chief complaint on file.  CAD  Wt Readings from Last 3 Encounters:  12/08/21 206 lb (93.4 kg)  03/23/21 197 lb 9.6 oz (89.6 kg)  03/01/21 199 lb 9.6 oz (90.5 kg)       History of Present Illness: Jacqueline Hodges is a 56 y.o. female  with a hx of  CAD .   She has a moderate stenosis in the distal LAD .  Given the small size of the vessel it was decided to treat her medically.   Prior records show that she has had some chest pain in the past.  In November 2022, carvedilol and isosorbide were increased.  She has been followed in the hypertension clinic as well.  She had issues with itching.    Due to swollen lymph nodes and the itching, her isosorbide was stopped in November 2022.  She was also referred to orthopedic surgery due to knee pain.  She is sen as an urgent visit today.  SHe reports chest pain that can happen randomly.  Relieved with SL NTG.  Not always related to exertion or physical activity.  Worse with stress.  She takes care of two young grandkids, 50,4.    Denies :  Leg edema. Nitroglycerin use. Orthopnea. Palpitations. Paroxysmal nocturnal dyspnea. Shortness of breath. Syncope.    Occasional dizziness as well.   No low BP readings noted.    Past Medical History:  Diagnosis Date   Abnormal perimenopausal bleeding 02/20/2017   CAD in native artery    a. NSTEMI 02/2021 with 70% dLAD, 25% mRCA but dLAD felt of uncertain significance, treated medically.   Cervical radiculopathy 01/12/2017   Diabetes mellitus ([place])    Fibroid uterus 02/20/2017   Hyperlipidemia LDL goal <70    Hypertension    Migraine with aura and without status migrainosus, not intractable 01/12/2017   Precordial chest pain     Past Surgical History:  Procedure Laterality Date   CESAREAN SECTION     LEFT HEART CATH AND CORONARY ANGIOGRAPHY N/A 02/01/2021    Procedure: LEFT HEART CATH AND CORONARY ANGIOGRAPHY;  Surgeon: Jacqueline Harp, MD;  Location: [place] [facility];  Service: Cardiovascular;  Laterality: N/A;     Current Outpatient Medications  Medication Sig Dispense Refill   amLODipine (NORVASC) 10 MG tablet Take 1 tablet (10 mg total) by mouth daily. 90 tablet 3   aspirin 81 MG EC tablet Take 1 tablet (81 mg total) by mouth daily. Swallow whole. 90 tablet 3   atorvastatin (LIPITOR) 80 MG tablet Take 1 tablet (80 mg total) by mouth daily. 90 tablet 3   carvedilol (COREG) 25 MG tablet Take 1 tablet (25 mg total) by mouth 2 (two) times daily. 180 tablet 3   clopidogrel (PLAVIX) 75 MG tablet Take 1 tablet (75 mg total) by mouth daily. 90 tablet 3   diclofenac Sodium (VOLTAREN) 1 % GEL SMARTSIG:2 Gram(s) Topical 4 Times Daily PRN     famotidine (PEPCID) 40 MG tablet Take 40 mg by mouth at bedtime.     isosorbide mononitrate (IMDUR) 30 MG 24 hr tablet Take 30 mg by mouth daily.     lisinopril-hydrochlorothiazide (ZESTORETIC) 20-12.5 MG tablet Take 2 tablets by mouth daily. 60 tablet 11   metFORMIN (GLUCOPHAGE) 500 MG tablet Take 1 tablet (500 mg total) by mouth 2 (  two) times daily with a meal. (Patient taking differently: Take 1,000 mg by mouth 2 (two) times daily with a meal.) 60 tablet 2   nitroGLYCERIN (NITROSTAT) 0.4 MG SL tablet Place 1 tablet (0.4 mg total) under the tongue every 5 (five) minutes x 3 doses as needed for chest pain. 25 tablet 2   rOPINIRole (REQUIP) 0.5 MG tablet Take 0.25 mg by mouth.     No current facility-administered medications for this visit.    Allergies:   Patient has no known allergies.    Social History:  The patient  reports that she has never smoked. She has never used smokeless tobacco. She reports that she does not drink alcohol and does not use drugs.   Family History:  The patient's family history includes Coronary artery disease in her mother; Diabetes in her mother; Heart failure in her mother; High  Cholesterol in her mother.    ROS:  Please see the history of present illness.   Otherwise, review of systems are positive for .   All other systems are reviewed and negative.    PHYSICAL EXAM: VS:  Ht '5\' 5"'$  (1.651 m)   Wt 206 lb (93.4 kg)   LMP 06/30/2017   BMI 34.28 kg/m  , BMI Body mass index is 34.28 kg/m. GEN: Well nourished, well developed, in no acute distress HEENT: normal Neck: no JVD, carotid bruits, or masses Cardiac: RRR; no murmurs, rubs, or gallops,no edema  Respiratory:  clear to auscultation bilaterally, normal work of breathing GI: soft, nontender, nondistended, + BS MS: no deformity or atrophy Skin: warm and dry, no rash Neuro:  Strength and sensation are intact Psych: euthymic mood, full affect   EKG:   The ekg ordered today demonstrates NSR, NSST   Recent Labs: 02/19/2021: ALT 27; Hemoglobin 11.8; Platelets 379; TSH 0.424 04/01/2021: BUN 13; Creatinine, Ser 1.06; Potassium 4.0; Sodium 139   Lipid Panel    Component Value Date/Time   CHOL 209 (H) 01/31/2021 0206   TRIG 115 01/31/2021 0206   HDL 47 01/31/2021 0206   CHOLHDL 4.4 01/31/2021 0206   VLDL 23 01/31/2021 0206   LDLCALC 139 (H) 01/31/2021 0206     Other studies Reviewed: Additional studies/ records that were reviewed today with results demonstrating: I personally reviewed the cath films from 02/2021.   ASSESSMENT AND PLAN:  CAD/old MI: Atypical chest pain.  We discussed the itching she had in 11/22.  She now thinks this was more related to the timing of taking all her medicines at once.  She now does not think it was due to the isosorbide in particular.  We can retry isosorbide 30 mg daily.  She is willing to try.  If she continues to have chest discomfort, could consider switching amlodipine to verapamil as verapamil may have more of an effect on her coronary microcirculation.  It is possible that she has microvascular disease.  Given the small nature of her coronaries, I would recommend  an invasive pressure wire study, but would rather try empiric therapy. HTN: Readings worse with stress.  Hopefully, additional Imdur would help.  We also talked about mindfulness based stress reduction to help with lowering blood pressure. Obesity: We spoke about increasing exercise to the target below.  Whole food, plant-based diet.   Current medicines are reviewed at length with the patient today.  The patient concerns regarding her medicines were addressed.  The following changes have been made:  No change  Labs/ tests ordered today include:  No orders of the defined types were placed in this encounter.   Recommend 150 minutes/week of aerobic exercise Low fat, low carb, high fiber diet recommended  Disposition:   FU with Dr. Acie Fredrickson in a few months   Signed, Larae Grooms, MD  12/08/2021 10:56 AM    [address], [place], [place]  [address] Phone: [phone]; Fax: [phone]

## 2022-02-15 ENCOUNTER — Emergency Department (HOSPITAL_BASED_OUTPATIENT_CLINIC_OR_DEPARTMENT_OTHER)
Admission: EM | Admit: 2022-02-15 | Discharge: 2022-02-15 | Disposition: A | Discharge status: Home / Self Care | Payer: Commercial Managed Care - HMO | Attending: Emergency Medicine | Admitting: Emergency Medicine

## 2022-02-15 ENCOUNTER — Emergency Department (HOSPITAL_BASED_OUTPATIENT_CLINIC_OR_DEPARTMENT_OTHER): Payer: Commercial Managed Care - HMO | Admitting: Radiology

## 2022-02-15 ENCOUNTER — Emergency Department (HOSPITAL_BASED_OUTPATIENT_CLINIC_OR_DEPARTMENT_OTHER): Payer: Commercial Managed Care - HMO

## 2022-02-15 ENCOUNTER — Encounter (HOSPITAL_BASED_OUTPATIENT_CLINIC_OR_DEPARTMENT_OTHER): Payer: Self-pay

## 2022-02-15 DIAGNOSIS — Z7982 Long term (current) use of aspirin: Secondary | ICD-10-CM | POA: Insufficient documentation

## 2022-02-15 DIAGNOSIS — I2510 Atherosclerotic heart disease of native coronary artery without angina pectoris: Secondary | ICD-10-CM | POA: Insufficient documentation

## 2022-02-15 DIAGNOSIS — R0781 Pleurodynia: Secondary | ICD-10-CM | POA: Insufficient documentation

## 2022-02-15 LAB — BASIC METABOLIC PANEL
Anion gap: 12 (ref 5–15)
BUN: 14 mg/dL (ref 6–20)
CO2: 26 mmol/L (ref 22–32)
Calcium: 9.9 mg/dL (ref 8.9–10.3)
Chloride: 102 mmol/L (ref 98–111)
Creatinine, Ser: 0.99 mg/dL (ref 0.44–1.00)
GFR, Estimated: >60 mL/min (ref >60)
Glucose, Bld: 158 mg/dL — ABNORMAL HIGH (ref 70–99)
Potassium: 4.3 mmol/L (ref 3.5–5.1)
Sodium: 140 mmol/L (ref 135–145)

## 2022-02-15 LAB — CBC
HCT: 36.9 % (ref 36.0–46.0)
Hemoglobin: 12.5 g/dL (ref 12.0–15.0)
MCH: 27.1 pg (ref 26.0–34.0)
MCHC: 33.9 g/dL (ref 30.0–36.0)
MCV: 79.9 fL — ABNORMAL LOW (ref 80.0–100.0)
Platelets: 261 10*3/uL (ref 150–400)
RBC: 4.62 MIL/uL (ref 3.87–5.11)
RDW: 13.5 % (ref 11.5–15.5)
WBC: 11 10*3/uL — ABNORMAL HIGH (ref 4.0–10.5)
nRBC: 0 % (ref 0.0–0.2)

## 2022-02-15 LAB — TROPONIN I (HIGH SENSITIVITY)
Troponin I (High Sensitivity): 3 ng/L (ref <18)
Troponin I (High Sensitivity): 4 ng/L (ref <18)

## 2022-02-15 LAB — D-DIMER, QUANTITATIVE: D-Dimer, Quant: 0.61 ug/mL-FEU — ABNORMAL HIGH (ref 0.00–0.50)

## 2022-02-15 MED ORDER — KETOROLAC TROMETHAMINE 15 MG/ML IJ SOLN
15.0000 mg | Freq: Once | INTRAMUSCULAR | Status: AC
  Administered 2022-02-15: 15 mg via INTRAVENOUS
  Filled 2022-02-15: qty 1

## 2022-02-15 MED ORDER — IOHEXOL 350 MG/ML SOLN
100.0000 mL | Freq: Once | INTRAVENOUS | Status: AC | PRN
  Administered 2022-02-15: 75 mL via INTRAVENOUS

## 2022-02-15 MED ORDER — NAPROXEN 375 MG PO TABS: ORAL_TABLET | ORAL | 0 refills | Status: AC

## 2022-02-15 NOTE — ED Provider Notes (Signed)
[facility] Provider Note: Georgena Spurling, MD, FACEP  CSN: [id number] MRN: [id number] ARRIVAL: 02/15/22 at [place]: [place]  Chest Pain   HISTORY OF PRESENT ILLNESS  02/15/22 5:12 AM Jacqueline Hodges is a 56 y.o. female with a history of coronary artery disease.  She is here with left-sided chest pain that awakened her from sleep just prior to arrival.  The pain is located beneath her left breast, it is sharp, stabbing and pleuritic.  She rates it as a 9 out of 10, worse with deep breathing.  She is also having some right-sided muscular pain in her back when she coughs.    Past Surgical History:  Procedure Laterality Date   CESAREAN SECTION     LEFT HEART CATH AND CORONARY ANGIOGRAPHY N/A 02/01/2021   Procedure: LEFT HEART CATH AND CORONARY ANGIOGRAPHY;  Surgeon: Lorretta Harp, MD;  Location: [facility];  Service: Cardiovascular;  Laterality: N/A;    Family History  Problem Relation Age of Onset   Heart failure Mother    Diabetes Mother    High Cholesterol Mother    Coronary artery disease Mother     Social History   Tobacco Use   Smoking status: Never   Smokeless tobacco: Never  Vaping Use   Vaping Use: Never used  Substance Use Topics   Alcohol use: No   Drug use: No    Prior to Admission medications   Medication Sig Start Date End Date Taking? Authorizing Provider  naproxen (NAPROSYN) 375 MG tablet Take 1 tablet twice daily as needed for sharp chest pain. 02/15/22  Yes Tamra Koos, MD  amLODipine (NORVASC) 10 MG tablet Take 1 tablet (10 mg total) by mouth daily. 04/22/21   Nahser, Wonda Cheng, MD  aspirin 81 MG EC tablet Take 1 tablet (81 mg total) by mouth daily. Swallow whole. 04/22/21   Nahser, Wonda Cheng, MD  atorvastatin (LIPITOR) 80 MG tablet Take 1 tablet (80 mg total) by mouth daily. 04/22/21   Nahser, Wonda Cheng, MD  carvedilol (COREG) 25 MG tablet Take 1 tablet (25 mg total) by mouth 2 (two) times daily. 07/15/21   Nahser,  Wonda Cheng, MD  clopidogrel (PLAVIX) 75 MG tablet Take 1 tablet (75 mg total) by mouth daily. 04/22/21   Nahser, Wonda Cheng, MD  diclofenac Sodium (VOLTAREN) 1 % GEL SMARTSIG:2 Gram(s) Topical 4 Times Daily PRN 02/21/21   [provider]  famotidine (PEPCID) 40 MG tablet Take 40 mg by mouth at bedtime. 04/13/21   [provider]  isosorbide mononitrate (IMDUR) 30 MG 24 hr tablet Take 1 tablet (30 mg total) by mouth daily. 12/08/21   Jettie Booze, MD  lisinopril-hydrochlorothiazide (ZESTORETIC) 20-12.5 MG tablet Take 2 tablets by mouth daily. 03/23/21 03/23/22  Swinyer, Lanice Schwab, NP  metFORMIN (GLUCOPHAGE) 500 MG tablet Take 1 tablet (500 mg total) by mouth 2 (two) times daily with a meal. 02/04/21   Sande Rives E, PA-C  nitroGLYCERIN (NITROSTAT) 0.4 MG SL tablet Place 1 tablet (0.4 mg total) under the tongue every 5 (five) minutes x 3 doses as needed for chest pain. 02/02/21   Sande Rives E, PA-C  rOPINIRole (REQUIP) 0.5 MG tablet Take 0.25 mg by mouth. 04/13/21   [provider]    Allergies Patient has no known allergies.   REVIEW OF SYSTEMS  Negative except as noted here or in the History of Present Illness.   PHYSICAL EXAMINATION  Initial Vital Signs Blood pressure Marland Kitchen)  162/90, pulse (!) 102, temperature 99.4 F (37.4 C), temperature source Oral, resp. rate 18, height '5\' 5"'$  (1.651 m), weight 92.5 kg, last menstrual period 06/30/2017, SpO2 100 %.  Examination General: Well-developed, well-nourished female in no acute distress; appearance consistent with age of record HENT: normocephalic; atraumatic Eyes: Normal appearance Neck: supple Heart: regular rate and rhythm Lungs: clear to auscultation bilaterally; breathing appears uncomfortable Chest: No external tenderness at site of pain Abdomen: soft; nondistended; nontender; bowel sounds present Back: Right upper muscle tenderness Extremities: No deformity; full range of motion Neurologic: Awake,  alert and oriented; motor function intact in all extremities and symmetric; no facial droop Skin: Warm and dry Psychiatric: Normal mood and affect   RESULTS  Summary of this visit's results, reviewed and interpreted by myself:   EKG Interpretation  Date/Time:  Tuesday February 15 2022 02:16:33 EST Ventricular Rate:  98 PR Interval:  164 QRS Duration: 70 QT Interval:  336 QTC Calculation: 428 R Axis:   7 Text Interpretation: Normal sinus rhythm Cannot rule out Anterior infarct (cited on or before 31-Jan-2021) Abnormal ECG Confirmed by Aniruddh Ciavarella [phone]) on 02/15/2022 2:21:50 AM       Laboratory Studies: Results for orders placed or performed during the hospital encounter of 02/15/22 (from the past 24 hour(s))  Basic metabolic panel     Status: Abnormal   Collection Time: 02/15/22  2:12 AM  Result Value Ref Range   Sodium 140 135 - 145 mmol/L   Potassium 4.3 3.5 - 5.1 mmol/L   Chloride 102 98 - 111 mmol/L   CO2 26 22 - 32 mmol/L   Glucose, Bld 158 (H) 70 - 99 mg/dL   BUN 14 6 - 20 mg/dL   Creatinine, Ser 0.99 0.44 - 1.00 mg/dL   Calcium 9.9 8.9 - 10.3 mg/dL   GFR, Estimated >60 >60 mL/min   Anion gap 12 5 - 15  CBC     Status: Abnormal   Collection Time: 02/15/22  2:12 AM  Result Value Ref Range   WBC 11.0 (H) 4.0 - 10.5 K/uL   RBC 4.62 3.87 - 5.11 MIL/uL   Hemoglobin 12.5 12.0 - 15.0 g/dL   HCT 36.9 36.0 - 46.0 %   MCV 79.9 (L) 80.0 - 100.0 fL   MCH 27.1 26.0 - 34.0 pg   MCHC 33.9 30.0 - 36.0 g/dL   RDW 13.5 11.5 - 15.5 %   Platelets 261 150 - 400 K/uL   nRBC 0.0 0.0 - 0.2 %  Troponin I (High Sensitivity)     Status: None   Collection Time: 02/15/22  2:12 AM  Result Value Ref Range   Troponin I (High Sensitivity) 3 <18 ng/L  Troponin I (High Sensitivity)     Status: None   Collection Time: 02/15/22  5:21 AM  Result Value Ref Range   Troponin I (High Sensitivity) 4 <18 ng/L  D-dimer, quantitative     Status: Abnormal   Collection Time: 02/15/22  5:21 AM   Result Value Ref Range   D-Dimer, Quant 0.61 (H) 0.00 - 0.50 ug/mL-FEU   Imaging Studies: CT Angio Chest PE W and/or Wo Contrast  Result Date: 02/15/2022 CLINICAL DATA:  56 year old female with left side chest pain that woke her from sleep. Upper back cramping. EXAM: CT ANGIOGRAPHY CHEST WITH CONTRAST TECHNIQUE: Multidetector CT imaging of the chest was performed using the standard protocol during bolus administration of intravenous contrast. Multiplanar CT image reconstructions and MIPs were obtained to evaluate the vascular anatomy.  RADIATION DOSE REDUCTION: This exam was performed according to the departmental dose-optimization program which includes automated exposure control, adjustment of the mA and/or kV according to patient size and/or use of iterative reconstruction technique. CONTRAST:  60m OMNIPAQUE IOHEXOL 350 MG/ML SOLN COMPARISON:  Chest radiographs 0240 hours today. FINDINGS: Cardiovascular: Good contrast bolus timing in the pulmonary arterial tree. Mild respiratory motion fairly limited to the right lower lobe. Central and hilar pulmonary arteries are normally enhancing, patent. No convincing pulmonary artery filling defect. No definite calcified coronary artery atherosclerosis. No cardiomegaly or pericardial effusion. Negative visible aorta. Mediastinum/Nodes: 19 mm hypodense left thyroid nodule which extends toward the midline on series 4, image 19. No mediastinal mass or lymphadenopathy. No hilar lymphadenopathy. Visible bilateral axillary lymph nodes are at the upper limits of normal, symmetric. Lungs/Pleura: Major airways are patent. There is minimal dependent atelectasis in both lungs, which are otherwise essentially negative. Left upper lobe or lingula 3-4 mm subpleural lung nodule on series 6, image 70. Upper Abdomen: Negative visible liver, spleen, pancreas, adrenal glands, bowel and left renal upper pole. No free air or free fluid in the upper abdomen. Musculoskeletal: Negative for  age. No acute osseous abnormality identified. Review of the MIP images confirms the above findings. IMPRESSION: 1. Negative for acute pulmonary embolus. 2. A 1.9 cm incidental left thyroid nodule. Recommend non-emergent Thyroid Ultrasound. Reference: J Am Coll Radiol. 2015 Feb;12(2): 143-50. 3. Small 3-4 mm left left lung pulmonary nodule. Per Fleischner Society Guidelines, if patient is low risk for malignancy, no routine follow-up imaging is recommended. If patient is high risk for malignancy, a non-contrast Chest CT at 12 months is optional. If performed and the nodule is stable at 12 months, no further follow-up is recommended. These guidelines do not apply to immunocompromised patients and patients with cancer. Follow up in patients with significant comorbidities as clinically warranted. For lung cancer screening, adhere to Lung-RADS guidelines. Reference: Radiology. 2017; 284(1):228-43. Electronically Signed   By: HGenevie AnnM.D.   On: 02/15/2022 06:27   DG Chest 2 View  Result Date: 02/15/2022 CLINICAL DATA:  Left-sided chest pain. EXAM: CHEST - 2 VIEW COMPARISON:  January 30, 2021 FINDINGS: The heart size and mediastinal contours are within normal limits. Both lungs are clear. The visualized skeletal structures are unremarkable. IMPRESSION: No active cardiopulmonary disease. Electronically Signed   By: TVirgina NorfolkM.D.   On: 02/15/2022 02:52    ED COURSE and MDM  Nursing notes, initial and subsequent vitals signs, including pulse oximetry, reviewed and interpreted by myself.  Vitals:   02/15/22 0211 02/15/22 0520 02/15/22 0545 02/15/22 0600  BP:  (!) 150/91 136/76 137/79  Pulse:  89 82 79  Resp:  17 (!) 25 (!) 21  Temp:      TempSrc:      SpO2:  97% 96% 95%  Weight: 92.5 kg     Height: '5\' 5"'$  (1.651 m)      Medications  ketorolac (TORADOL) 15 MG/ML injection 15 mg (15 mg Intravenous Given 02/15/22 0534)  iohexol (OMNIPAQUE) 350 MG/ML injection 100 mL (75 mLs Intravenous Contrast Given  02/15/22 0601)   5:57 AM Patient's pain is pleuritic and her D-dimer is above the age-adjusted cutoff.  We will obtain a CT angio chest to evaluate for pulmonary embolism.  6:32 AM Difficult relief had pleuritic pain with IV Toradol.  No pulmonary embolism seen on CT scan.  We will treat for pleuritic chest pain.  No evidence of cardiac ischemia per EKG or  troponins.   PROCEDURES  Procedures   ED DIAGNOSES     ICD-10-CM   1. Pleuritic chest pain  R07.81          Shanon Rosser, MD 02/15/22 [phone]

## 2022-02-15 NOTE — ED Notes (Signed)
Pt verbalizes understanding of discharge instructions. Opportunity for questioning and answers were provided. Pt discharged from ED to home with husband.    

## 2022-02-15 NOTE — ED Triage Notes (Signed)
Pt presents to the ED with left sided chest pain that woke her up out of her sleep. States that she is also having cramping in her upper back. EKG obtained. Pt A&Ox4 at time of triage. VSS.

## 2022-03-13 ENCOUNTER — Encounter: Payer: Self-pay | Admitting: Cardiovascular Disease

## 2022-03-13 NOTE — Progress Notes (Unsigned)
In her distal Cardiology Office Note:    Date:  03/14/2022   ID:  Jacqueline Hodges, DOB [birth year], MRN [id number]  PCP:  [name], NP   [facility] Providers Cardiologist:  Mertie Moores, MD { Cardiology APP:   Christen Bame, NP     Referring MD: [name], NP   Chief Complaint  Patient presents with   Coronary Artery Disease           Nov. 21, 2022   Jacqueline Hodges is a 56 y.o. female with a hx of  CAD .   She has a moderate stenosis in the distal LAD .  Given the small size of the vessel it was decided to treat her medically.  She was seen several weeks ago by Christen Bame , NP.  She was having some episodes of chest pain.  Her carvedilol was increased and isosorbide was added to her med list.  She is seen back today for further evaluation.  She is feeling better.  Had 1 episode of CP  Has had more itching , all over body, Also has swollen lymph nodes in her posterior cervical neck .  Denies any additional exposures.    She thinks that the increased meds are helping with the angina .  May not be as mentally fast as she used to be . Is still out of work   Dec. 4, 2023  Jacqueline Hodges is see for follow up of her chest pain  Has moderate CAD of her distal LAD   Had some CP severel weeks ago  Went to there ER, troponins were 3,4      Past Medical History:  Diagnosis Date   Abnormal perimenopausal bleeding 02/20/2017   CAD in native artery    a. NSTEMI 02/2021 with 70% dLAD, 25% mRCA but dLAD felt of uncertain significance, treated medically.   Cervical radiculopathy 01/12/2017   Diabetes mellitus ([place])    Fibroid uterus 02/20/2017   Hyperlipidemia LDL goal <70    Hypertension    Migraine with aura and without status migrainosus, not intractable 01/12/2017   Precordial chest pain     Past Surgical History:  Procedure Laterality Date   CESAREAN SECTION     LEFT HEART CATH AND CORONARY ANGIOGRAPHY N/A 02/01/2021   Procedure: LEFT HEART CATH AND  CORONARY ANGIOGRAPHY;  Surgeon: Lorretta Harp, MD;  Location: [facility];  Service: Cardiovascular;  Laterality: N/A;    Current Medications: Current Meds  Medication Sig   amLODipine (NORVASC) 10 MG tablet Take 1 tablet (10 mg total) by mouth daily.   atorvastatin (LIPITOR) 80 MG tablet Take 1 tablet (80 mg total) by mouth daily.   carvedilol (COREG) 25 MG tablet Take 1 tablet (25 mg total) by mouth 2 (two) times daily.   diclofenac Sodium (VOLTAREN) 1 % GEL SMARTSIG:2 Gram(s) Topical 4 Times Daily PRN   famotidine (PEPCID) 40 MG tablet Take 40 mg by mouth at bedtime.   isosorbide mononitrate (IMDUR) 60 MG 24 hr tablet Take 1 tablet (60 mg total) by mouth daily.   lisinopril-hydrochlorothiazide (ZESTORETIC) 20-12.5 MG tablet Take 2 tablets by mouth daily.   metFORMIN (GLUCOPHAGE) 500 MG tablet Take 1 tablet (500 mg total) by mouth 2 (two) times daily with a meal.   naproxen (NAPROSYN) 375 MG tablet Take 1 tablet twice daily as needed for sharp chest pain.   rOPINIRole (REQUIP) 0.5 MG tablet Take 0.25 mg by mouth.   [DISCONTINUED] aspirin 81 MG EC tablet Take  1 tablet (81 mg total) by mouth daily. Swallow whole.   [DISCONTINUED] clopidogrel (PLAVIX) 75 MG tablet Take 1 tablet (75 mg total) by mouth daily.   [DISCONTINUED] isosorbide mononitrate (IMDUR) 30 MG 24 hr tablet Take 1 tablet (30 mg total) by mouth daily.   [DISCONTINUED] nitroGLYCERIN (NITROSTAT) 0.4 MG SL tablet Place 1 tablet (0.4 mg total) under the tongue every 5 (five) minutes x 3 doses as needed for chest pain.     Allergies:   Patient has no known allergies.   Social History   Socioeconomic History   Marital status: Single    Spouse name: Not on file   Number of children: Not on file   Years of education: Not on file   Highest education level: Not on file  Occupational History   Not on file  Tobacco Use   Smoking status: Never   Smokeless tobacco: Never  Vaping Use   Vaping Use: Never used   Substance and Sexual Activity   Alcohol use: No   Drug use: No   Sexual activity: Yes    Birth control/protection: Surgical    Comment: [place]  Other Topics Concern   Not on file  Social History Narrative   daughter Lonn [place] and significant other Herbie [place]   Social Determinants of Health   Financial Resource Strain: Low Risk  (06/07/2021)   Overall Financial Resource Strain (CARDIA)    Difficulty of Paying Living Expenses: Not hard at all  Food Insecurity: No Food Insecurity (06/07/2021)   Hunger Vital Sign    Worried About Running Out of Food in the Last Year: Never true    Shafer in the Last Year: Never true  Transportation Needs: No Transportation Needs (06/07/2021)   PRAPARE - Hydrologist (Medical): No    Lack of Transportation (Non-Medical): No  Physical Activity: Not on file  Stress: No Stress Concern Present (04/01/2021)   [place]    Feeling of Stress : Only a little  Social Connections: Not on file     Family History: The patient's family history includes Coronary artery disease in her mother; Diabetes in her mother; Heart failure in her mother; High Cholesterol in her mother.  ROS:   Please see the history of present illness.     All other systems reviewed and are negative.  EKGs/Labs/Other Studies Reviewed:    The following studies were reviewed today:   EKG:     Recent Labs: 02/15/2022: BUN 14; Creatinine, Ser 0.99; Hemoglobin 12.5; Platelets 261; Potassium 4.3; Sodium 140  Recent Lipid Panel    Component Value Date/Time   CHOL 209 (H) 01/31/2021 0206   TRIG 115 01/31/2021 0206   HDL 47 01/31/2021 0206   CHOLHDL 4.4 01/31/2021 0206   VLDL 23 01/31/2021 0206   LDLCALC 139 (H) 01/31/2021 0206     Risk Assessment/Calculations:          Physical Exam:    Physical Exam: Blood pressure (!) 140/80, pulse 67, height '5\' 5"'$  (1.651 m), weight 206 lb  6.4 oz (93.6 kg), last menstrual period 06/30/2017, SpO2 98 %.  HYPERTENSION CONTROL Vitals:   03/14/22 0925 03/14/22 1708  BP: (!) 140/88 (!) 140/80    The patient's blood pressure is elevated above target today.  In order to address the patient's elevated BP: The blood pressure is usually elevated in clinic.  Blood pressures monitored at home have been optimal.  GEN:  Well nourished, well developed in no acute distress HEENT: Normal NECK: No JVD; No carotid bruits LYMPHATICS: No lymphadenopathy CARDIAC: RRR , no murmurs, rubs, gallops RESPIRATORY:  Clear to auscultation without rales, wheezing or rhonchi  ABDOMEN: Soft, non-tender, non-distended MUSCULOSKELETAL:  No edema; No deformity  SKIN: Warm and dry NEUROLOGIC:  Alert and oriented x 3   ASSESSMENT:    1. Coronary artery disease of native artery of native heart with stable angina pectoris ([place])   2. Mixed hyperlipidemia     PLAN:      CAD : [place] has moderate to severe stenosis of her distal LAD.  It does not look flow obstructive but it is possible that she may have angina if she developed high blood pressure.  Will increase her isosorbide to 60 mg a day.  I encouraged her to work on weight loss.  She is already on high-dose of multiple medications.  2.  Hyperlipidemia: Will check lipids, ALT, basic metabolic profile today.           Medication Adjustments/Labs and Tests Ordered: Current medicines are reviewed at length with the patient today.  Concerns regarding medicines are outlined above.  Orders Placed This Encounter  Procedures   Lipid panel   ALT   Basic metabolic panel    Meds ordered this encounter  Medications   aspirin EC 81 MG tablet    Sig: Take 1 tablet (81 mg total) by mouth daily. Swallow whole.    Dispense:  90 tablet    Refill:  3   clopidogrel (PLAVIX) 75 MG tablet    Sig: Take 1 tablet (75 mg total) by mouth daily.    Dispense:  90 tablet    Refill:  3    nitroGLYCERIN (NITROSTAT) 0.4 MG SL tablet    Sig: Dissolve 1 tablet under the tongue every 5 minutes as needed for chest pain. Max of 3 doses, then 911.    Dispense:  25 tablet    Refill:  6   isosorbide mononitrate (IMDUR) 60 MG 24 hr tablet    Sig: Take 1 tablet (60 mg total) by mouth daily.    Dispense:  90 tablet    Refill:  3     Patient Instructions  Medication Instructions:  INCREASE Imdur (Isosorbide Mononitrate0 to '60mg'$  daily *If you need a refill on your cardiac medications before your next appointment, please call your pharmacy*   Lab Work: Lipids, ALT, BMET today If you have labs (blood work) drawn today and your tests are completely normal, you will receive your results only by: [place] (if you have MyChart) OR A paper copy in the mail If you have any lab test that is abnormal or we need to change your treatment, we will call you to review the results.   Testing/Procedures: NONE   Follow-Up: At [facility], you and your health needs are our priority.  As part of our continuing mission to provide you with exceptional heart care, we have created designated Provider Care Teams.  These Care Teams include your primary Cardiologist (physician) and Advanced Practice Providers (APPs -  Physician Assistants and Nurse Practitioners) who all work together to provide you with the care you need, when you need it.  Your next appointment:   6 month(s)  The format for your next appointment:   In Person  Provider:   Mertie Moores, MD       Important Information About Sugar  Signed, Mertie Moores, MD  03/14/2022 5:09 PM    [place]

## 2022-03-14 ENCOUNTER — Ambulatory Visit: Payer: Commercial Managed Care - HMO | Attending: Cardiovascular Disease | Admitting: Cardiovascular Disease

## 2022-03-14 ENCOUNTER — Encounter: Payer: Self-pay | Admitting: Cardiovascular Disease

## 2022-03-14 VITALS — BP 140/80 | HR 67 | Ht 65.0 in | Wt 206.4 lb

## 2022-03-14 DIAGNOSIS — I25118 Atherosclerotic heart disease of native coronary artery with other forms of angina pectoris: Secondary | ICD-10-CM | POA: Diagnosis not present

## 2022-03-14 DIAGNOSIS — E782 Mixed hyperlipidemia: Secondary | ICD-10-CM

## 2022-03-14 MED ORDER — NITROGLYCERIN 0.4 MG SL SUBL: SUBLINGUAL_TABLET | SUBLINGUAL | 6 refills | Status: AC

## 2022-03-14 MED ORDER — CLOPIDOGREL BISULFATE 75 MG PO TABS: 75.0000 mg | ORAL_TABLET | Freq: Every day | ORAL | 3 refills | Status: DC

## 2022-03-14 MED ORDER — ISOSORBIDE MONONITRATE ER 60 MG PO TB24: 60.0000 mg | ORAL_TABLET | Freq: Every day | ORAL | 3 refills | Status: AC

## 2022-03-14 MED ORDER — ASPIRIN 81 MG PO TBEC: 81.0000 mg | DELAYED_RELEASE_TABLET | Freq: Every day | ORAL | 3 refills | Status: AC

## 2022-03-14 NOTE — Patient Instructions (Signed)
Medication Instructions:  INCREASE Imdur (Isosorbide Mononitrate0 to '60mg'$  daily *If you need a refill on your cardiac medications before your next appointment, please call your pharmacy*   Lab Work: Lipids, ALT, BMET today If you have labs (blood work) drawn today and your tests are completely normal, you will receive your results only by: [place] (if you have MyChart) OR A paper copy in the mail If you have any lab test that is abnormal or we need to change your treatment, we will call you to review the results.   Testing/Procedures: NONE   Follow-Up: At [facility], you and your health needs are our priority.  As part of our continuing mission to provide you with exceptional heart care, we have created designated Provider Care Teams.  These Care Teams include your primary Cardiologist (physician) and Advanced Practice Providers (APPs -  Physician Assistants and Nurse Practitioners) who all work together to provide you with the care you need, when you need it.  Your next appointment:   6 month(s)  The format for your next appointment:   In Person  Provider:   Mertie Moores, MD       Important Information About Sugar

## 2022-03-15 LAB — BASIC METABOLIC PANEL
BUN/Creatinine Ratio: 12 (ref 9–23)
BUN: 11 mg/dL (ref 6–24)
CO2: 25 mmol/L (ref 20–29)
Calcium: 9 mg/dL (ref 8.7–10.2)
Chloride: 104 mmol/L (ref 96–106)
Creatinine, Ser: 0.9 mg/dL (ref 0.57–1.00)
Glucose: 145 mg/dL — ABNORMAL HIGH (ref 70–99)
Potassium: 4 mmol/L (ref 3.5–5.2)
Sodium: 141 mmol/L (ref 134–144)
eGFR: 75 mL/min/{1.73_m2} (ref >59)

## 2022-03-15 LAB — LIPID PANEL
Chol/HDL Ratio: 5.1 ratio — ABNORMAL HIGH (ref 0.0–4.4)
Cholesterol, Total: 177 mg/dL (ref 100–199)
HDL: 35 mg/dL — ABNORMAL LOW (ref >39)
LDL Chol Calc (NIH): 118 mg/dL — ABNORMAL HIGH (ref 0–99)
Triglycerides: 132 mg/dL (ref 0–149)
VLDL Cholesterol Cal: 24 mg/dL (ref 5–40)

## 2022-03-15 LAB — ALT: ALT: 35 IU/L — ABNORMAL HIGH (ref 0–32)

## 2022-03-18 ENCOUNTER — Telehealth: Payer: Self-pay | Admitting: Cardiovascular Disease

## 2022-03-18 DIAGNOSIS — E782 Mixed hyperlipidemia: Secondary | ICD-10-CM

## 2022-03-18 DIAGNOSIS — Z79899 Other long term (current) drug therapy: Secondary | ICD-10-CM

## 2022-03-18 MED ORDER — EZETIMIBE 10 MG PO TABS: 10.0000 mg | ORAL_TABLET | Freq: Every day | ORAL | 3 refills | Status: AC

## 2022-03-18 NOTE — Telephone Encounter (Signed)
-----   Message from Thayer Headings, MD sent at 03/16/2022  9:15 AM EST ----- LDL is 118 Has moderate CAD, on atorvastatin 80 mg a day Add zetia 10 mg a day Recheck lipids, BMP in 3 months

## 2022-03-18 NOTE — Telephone Encounter (Signed)
Fllow Up:     Patient is returning call from today,concerning her results.

## 2022-03-18 NOTE — Telephone Encounter (Signed)
Complete-see telephone encounter from today.

## 2022-03-18 NOTE — Telephone Encounter (Signed)
Spoke with patient who agrees to begin zetia and do repeat labs in 3 months. Medication sent to pharmacy on file, labs entered and scheduled for 06/16/21

## 2022-06-17 ENCOUNTER — Other Ambulatory Visit: Payer: Commercial Managed Care - HMO

## 2022-06-22 ENCOUNTER — Other Ambulatory Visit: Payer: Commercial Managed Care - HMO

## 2022-06-28 ENCOUNTER — Ambulatory Visit: Payer: Commercial Managed Care - HMO | Attending: Cardiovascular Disease

## 2022-07-11 ENCOUNTER — Other Ambulatory Visit: Payer: Self-pay | Admitting: Nurse Practitioner

## 2022-09-20 ENCOUNTER — Ambulatory Visit: Payer: Commercial Managed Care - HMO | Attending: Cardiovascular Disease | Admitting: Cardiovascular Disease

## 2022-09-20 ENCOUNTER — Encounter: Payer: Self-pay | Admitting: Cardiovascular Disease

## 2022-11-06 ENCOUNTER — Emergency Department (HOSPITAL_BASED_OUTPATIENT_CLINIC_OR_DEPARTMENT_OTHER)
Admission: EM | Admit: 2022-11-06 | Discharge: 2022-11-06 | Disposition: A | Discharge status: Home / Self Care | Payer: Commercial Managed Care - HMO | Attending: Emergency Medicine | Admitting: Emergency Medicine

## 2022-11-06 ENCOUNTER — Other Ambulatory Visit: Payer: Self-pay

## 2022-11-06 ENCOUNTER — Encounter (HOSPITAL_BASED_OUTPATIENT_CLINIC_OR_DEPARTMENT_OTHER): Payer: Self-pay

## 2022-11-06 DIAGNOSIS — E119 Type 2 diabetes mellitus without complications: Secondary | ICD-10-CM | POA: Insufficient documentation

## 2022-11-06 DIAGNOSIS — Z7984 Long term (current) use of oral hypoglycemic drugs: Secondary | ICD-10-CM | POA: Diagnosis not present

## 2022-11-06 DIAGNOSIS — I10 Essential (primary) hypertension: Secondary | ICD-10-CM | POA: Insufficient documentation

## 2022-11-06 DIAGNOSIS — Z79899 Other long term (current) drug therapy: Secondary | ICD-10-CM | POA: Insufficient documentation

## 2022-11-06 DIAGNOSIS — T7840XA Allergy, unspecified, initial encounter: Secondary | ICD-10-CM | POA: Diagnosis not present

## 2022-11-06 DIAGNOSIS — Z7982 Long term (current) use of aspirin: Secondary | ICD-10-CM | POA: Diagnosis not present

## 2022-11-06 MED ORDER — DIPHENHYDRAMINE HCL 50 MG/ML IJ SOLN
25.0000 mg | Freq: Once | INTRAMUSCULAR | Status: AC
  Administered 2022-11-06: 25 mg via INTRAVENOUS
  Filled 2022-11-06: qty 1

## 2022-11-06 MED ORDER — FAMOTIDINE 20 MG PO TABS: 20.0000 mg | ORAL_TABLET | Freq: Two times a day (BID) | ORAL | 0 refills | Status: DC

## 2022-11-06 MED ORDER — DIPHENHYDRAMINE HCL 25 MG PO CAPS
50.0000 mg | ORAL_CAPSULE | Freq: Once | ORAL | Status: AC
  Administered 2022-11-06: 50 mg via ORAL
  Filled 2022-11-06: qty 2

## 2022-11-06 MED ORDER — HYDRALAZINE HCL 20 MG/ML IJ SOLN
10.0000 mg | Freq: Once | INTRAMUSCULAR | Status: AC
  Administered 2022-11-06: 10 mg via INTRAVENOUS
  Filled 2022-11-06: qty 1

## 2022-11-06 MED ORDER — PREDNISONE 20 MG PO TABS: ORAL_TABLET | ORAL | 0 refills | Status: AC

## 2022-11-06 MED ORDER — DEXAMETHASONE SODIUM PHOSPHATE 10 MG/ML IJ SOLN
10.0000 mg | Freq: Once | INTRAMUSCULAR | Status: AC
  Administered 2022-11-06: 10 mg via INTRAVENOUS
  Filled 2022-11-06: qty 1

## 2022-11-06 MED ORDER — EPINEPHRINE 0.3 MG/0.3ML IJ SOAJ: 0.3000 mg | INTRAMUSCULAR | 0 refills | Status: AC | PRN

## 2022-11-06 MED ORDER — EPINEPHRINE 0.3 MG/0.3ML IJ SOAJ
0.3000 mg | Freq: Once | INTRAMUSCULAR | Status: AC
  Administered 2022-11-06: 0.3 mg via INTRAMUSCULAR
  Filled 2022-11-06: qty 0.3

## 2022-11-06 MED ORDER — FAMOTIDINE IN NACL 20-0.9 MG/50ML-% IV SOLN
20.0000 mg | Freq: Once | INTRAVENOUS | Status: AC
  Administered 2022-11-06: 20 mg via INTRAVENOUS
  Filled 2022-11-06: qty 50

## 2022-11-06 NOTE — ED Triage Notes (Signed)
Pt reports yesterday evening broke out in hives all over, lip swelling and chest tightness.

## 2022-11-06 NOTE — ED Notes (Signed)
Dc instructions reviewed with patient. Patient voiced understanding. Dc with belongings. Reviewed administration of epi pen, voiced understanding. Also provided a handout

## 2022-11-06 NOTE — ED Provider Notes (Signed)
[place] EMERGENCY DEPARTMENT AT [facility] Provider Note   CSN: [id number] Arrival date & time: 11/06/22  2725     History  Chief Complaint  Patient presents with   Allergic Reaction    Jacqueline Hodges is a 57 y.o. female with a past medical history of hypertension, diabetes who presents emergency department with concerns for allergic reaction onset last night.  Notes that she noticed pruritic rash and lip swelling last night.  Also noticed right-sided facial swelling last night.  No meds tried prior to arrival.  Initially noticed chest tightness, throat closing sensation.  Denies nausea, vomiting, wheezing.  Was unable to take her antihypertensives today.  Had her lisinopril discontinued 5 days ago by her primary care provider.  Has been on lisinopril for several years.  It was discontinued due to the cough that she has been having her lisinopril.  The history is provided by the patient. No language interpreter was used.       Home Medications Prior to Admission medications   Medication Sig Start Date End Date Taking? Authorizing Provider  EPINEPHrine 0.3 mg/0.3 mL IJ SOAJ injection Inject 0.3 mg into the muscle as needed for anaphylaxis. 11/06/22  Yes Kemya Shed A, PA-C  famotidine (PEPCID) 20 MG tablet Take 1 tablet (20 mg total) by mouth 2 (two) times daily. 11/06/22  Yes Nancee Brownrigg A, PA-C  lisinopril-hydrochlorothiazide (ZESTORETIC) 20-12.5 MG tablet TAKE 2 TABLETS BY MOUTH DAILY 07/11/22 07/11/23 Yes Nahser, Deloris Ping, MD  predniSONE (DELTASONE) 20 MG tablet 3 tabs po daily x 3 days, then 2 tabs x 3 days, then 1.5 tabs x 3 days, then 1 tab x 3 days, then 0.5 tabs x 3 days 11/06/22  Yes Kashia Brossard A, PA-C  amLODipine (NORVASC) 10 MG tablet Take 1 tablet (10 mg total) by mouth daily. 04/22/21   Nahser, Deloris Ping, MD  aspirin EC 81 MG tablet Take 1 tablet (81 mg total) by mouth daily. Swallow whole. 03/14/22   Nahser, Deloris Ping, MD  atorvastatin (LIPITOR) 80 MG tablet Take  1 tablet (80 mg total) by mouth daily. 04/22/21   Nahser, Deloris Ping, MD  carvedilol (COREG) 25 MG tablet Take 1 tablet (25 mg total) by mouth 2 (two) times daily. 07/15/21   Nahser, Deloris Ping, MD  clopidogrel (PLAVIX) 75 MG tablet Take 1 tablet (75 mg total) by mouth daily. 03/14/22   Nahser, Deloris Ping, MD  diclofenac Sodium (VOLTAREN) 1 % GEL SMARTSIG:2 Gram(s) Topical 4 Times Daily PRN 02/21/21   [provider]  ezetimibe (ZETIA) 10 MG tablet Take 1 tablet (10 mg total) by mouth daily. 03/18/22   Nahser, Deloris Ping, MD  famotidine (PEPCID) 40 MG tablet Take 40 mg by mouth at bedtime. 04/13/21   [provider]  isosorbide mononitrate (IMDUR) 60 MG 24 hr tablet Take 1 tablet (60 mg total) by mouth daily. 03/14/22   Nahser, Deloris Ping, MD  metFORMIN (GLUCOPHAGE) 500 MG tablet Take 1 tablet (500 mg total) by mouth 2 (two) times daily with a meal. 02/04/21   Marjie Skiff E, PA-C  naproxen (NAPROSYN) 375 MG tablet Take 1 tablet twice daily as needed for sharp chest pain. 02/15/22   Molpus, John, MD  nitroGLYCERIN (NITROSTAT) 0.4 MG SL tablet Dissolve 1 tablet under the tongue every 5 minutes as needed for chest pain. Max of 3 doses, then 911. 03/14/22   Nahser, Deloris Ping, MD  rOPINIRole (REQUIP) 0.5 MG tablet Take 0.25 mg by mouth. 04/13/21  [provider]      Allergies    Patient has no known allergies.    Review of Systems   Review of Systems  All other systems reviewed and are negative.   Physical Exam Updated Vital Signs BP (!) 228/114 (BP Location: Right Arm)   Pulse 81   Temp 99.5 F (37.5 C) (Oral)   Resp 18   LMP 06/30/2017   SpO2 94%  Physical Exam Vitals and nursing note reviewed.  Constitutional:      General: She is not in acute distress.    Appearance: Normal appearance.  HENT:     Mouth/Throat:     Comments: Uvula midline without swelling. No posterior pharyngeal erythema or tonsillar exudate noted. Patent airway. Pt able to speak in clear complete  sentences. Tolerating oral secretions.  Swelling noted to lips and right side of face. Eyes:     General: No scleral icterus.    Extraocular Movements: Extraocular movements intact.  Cardiovascular:     Rate and Rhythm: Normal rate.  Pulmonary:     Effort: Pulmonary effort is normal. No respiratory distress.  Abdominal:     Palpations: Abdomen is soft. There is no mass.     Tenderness: There is no abdominal tenderness.  Musculoskeletal:        General: Normal range of motion.     Cervical back: Neck supple.  Skin:    General: Skin is warm and dry.     Findings: No rash.     Comments: Diffuse urticarial rash noted to bilateral upper extremities and torso.  Neurological:     Mental Status: She is alert.     Sensory: Sensation is intact.     Motor: Motor function is intact.  Psychiatric:        Behavior: Behavior normal.     ED Results / Procedures / Treatments   Labs (all labs ordered are listed, but only abnormal results are displayed) Labs Reviewed - No data to display  EKG None  Radiology No results found.  Procedures .Critical Care E&M  Performed by: Karenann Cai, PA-C Critical care provider statement:    Critical care time (minutes):  20   Critical care was necessary to treat or prevent imminent or life-threatening deterioration of the following conditions:  Respiratory failure   Critical care was time spent personally by me on the following activities:  Obtaining history from patient or surrogate, review of old charts, re-evaluation of patient's condition, ordering and performing treatments and interventions and evaluation of patient's response to treatment After initial E/M assessment, critical care services were subsequently performed that were exclusive of separately billable procedures or treatment.       Medications Ordered in ED Medications  dexamethasone (DECADRON) injection 10 mg (10 mg Intravenous Given 11/06/22 0935)  famotidine (PEPCID) IVPB 20  mg premix (0 mg Intravenous Stopped 11/06/22 1055)  diphenhydrAMINE (BENADRYL) capsule 50 mg (50 mg Oral Given 11/06/22 0937)  EPINEPHrine (EPI-PEN) injection 0.3 mg (0.3 mg Intramuscular Given 11/06/22 0941)  hydrALAZINE (APRESOLINE) injection 10 mg (10 mg Intravenous Given 11/06/22 1055)  diphenhydrAMINE (BENADRYL) injection 25 mg (25 mg Intravenous Given 11/06/22 1347)    ED Course/ Medical Decision Making/ A&P Clinical Course as of 11/06/22 1842  Sun Nov 06, 2022  1106 Re-evaluated and resting comfortably on stretcher. Evaluation of rash and notes some improvement of symptoms. Pt notes some itching still, however, rash has improved some. [SB]  1229 Re-evaluated and ambulatory in the ED from room  to restroom without assistance or difficulty.  [SB]  1515 Re-evaluated and noted improvement of symptoms with treatment regimen. Discussed discharge treatment plan. Pt agreeable at this time. Pt appears safe for discharge. [SB]    Clinical Course User Index [SB] Bee Marchiano A, PA-C                             Medical Decision Making Risk Prescription drug management.   Patient presents to the ED with allergic reaction onset last night.  No meds tried prior to arrival.  Patient afebrile, initial blood pressure elevated at 228/114 likely in the setting of patient not taking her medications this morning.  On exam with Uvula midline without swelling. No posterior pharyngeal erythema or tonsillar exudate noted. Patent airway. Pt able to speak in clear complete sentences. Tolerating oral secretions.  Swelling noted to lips and right side of face.  Diffuse urticarial rash noted to bilateral upper extremities and torso.  No concern for airway compromise at this time. Differential diagnosis includes anaphylaxis, angioedema, contact dermatitis.    Medications:  I ordered medication including zofran, pepcid, benadryl, Decadron, EpiPen, for anaphylaxis Reevaluation of the patient after these medicines and  interventions, I reevaluated the patient and found that they have improved I have reviewed the patients home medicines and have made adjustments as needed  Patient observed in the emergency department for 6 and half hours with symptoms resolving at this time.  Disposition: Presenting suspicious for likely allergic reaction.  Doubt concerns this time for angioedema due to symptoms resolving with anaphylaxis treatment in the emergency department.  AT this time for contact dermatitis..  Patient evaluated by attending.  Attending agrees with discharge treatment plan of prednisone taper, Pepcid twice daily and Benadryl.  After consideration of the diagnostic results and the patients response to treatment, I feel that the patient would benefit from Discharge home. Prescription for epinephrine provided.  Prescription for Pepcid, prednisone provided as well.  Supportive care measures and strict return precautions discussed with patient at bedside. Pt acknowledges and verbalizes understanding. Pt appears safe for discharge. Follow up as indicated in discharge paperwork.  This chart was dictated using voice recognition software, Dragon. Despite the best efforts of this provider to proofread and correct errors, errors may still occur which can change documentation meaning.   Final Clinical Impression(s) / ED Diagnoses Final diagnoses:  Allergic reaction, initial encounter    Rx / DC Orders ED Discharge Orders          Ordered    famotidine (PEPCID) 20 MG tablet  2 times daily        11/06/22 1520    predniSONE (DELTASONE) 20 MG tablet        11/06/22 1520    EPINEPHrine 0.3 mg/0.3 mL IJ SOAJ injection  As needed        11/06/22 1520              Tashera Montalvo A, PA-C 11/06/22 1843    Arby Barrette, MD 11/22/22 1412

## 2022-11-06 NOTE — Discharge Instructions (Addendum)
It was a pleasure taking care of you today!   You will be sent a prescription for Pepcid, prednisone (steroid) taper.  Take these medications as prescribed.  You may also take 50 mg Benadryl every 6 hours as needed for your symptoms.  You will be sent a prescription for EpiPen.  The attached instructions give directions on how to and when to use the EpiPen.  Continue to take your medications as prescribed.  You may follow-up with your primary care provider as needed.  Return to the emergency department if you are experiencing increasing/worsening chest tightness, shortness of breath, hives, or worsening symptoms.

## 2022-11-06 NOTE — ED Provider Notes (Signed)
I provided a substantive portion of the care of this patient.  I personally made/approved the management plan for this patient and take responsibility for the patient management.     I reviewed the patient's history of present illness and perform physical exam.  Differential diagnosis includes ACE inhibitor angioedema versus allergic reaction to unknown trigger.  I recommend hydralazine for hypertensive urgency.  Patient does not have symptoms of hypertensive emergency but has significantly elevated blood pressure 228/114.  He has not taken her daily medications at this point.   15:04 recheck: Patient reports she feels subjectively improved.  She reports that the swelling around her mouth and lips is gone down a lot and she has less itching on her arms.  This point with improvement of swelling and decreased itching, I feel patient is stable for discharge.  I have higher suspicion for allergic reaction than ACE inhibitor angioedema.  Body itching and urticaria are less typical of ACE inhibitor angioedema.  Patient stopped ACE inhibitor's yesterday not due to angioedema but occasion changed by PCP for persistent cough.  I have reviewed strict return precautions with patient.  She voices understanding.  At this time plan will be to put the patient on a prednisone taper, Pepcid twice daily and Benadryl.  CRITICAL CARE Performed by: Arby Barrette   Total critical care time: 20  minutes  Critical care time was exclusive of separately billable procedures and treating other patients.  Critical care was necessary to treat or prevent imminent or life-threatening deterioration.  Critical care was time spent personally by me on the following activities: development of treatment plan with patient and/or surrogate as well as nursing, discussions with consultants, evaluation of patient's response to treatment, examination of patient, obtaining history from patient or surrogate, ordering and performing  treatments and interventions, ordering and review of laboratory studies, ordering and review of radiographic studies, pulse oximetry and re-evaluation of patient's condition.    Arby Barrette, MD 11/06/22 [phone]

## 2022-11-17 IMAGING — US US PELVIS COMPLETE WITH TRANSVAGINAL
1 series · 13 of 25 positions shown · non-contrast
Comparison: Pelvic ultrasound dated January 28, 2017.

CLINICAL DATA: Vaginal bleeding.  Postmenopausal.



[Series 1: us pelvis complete with transvaginal · 13 of 78 slices shown]
[im 1/78]
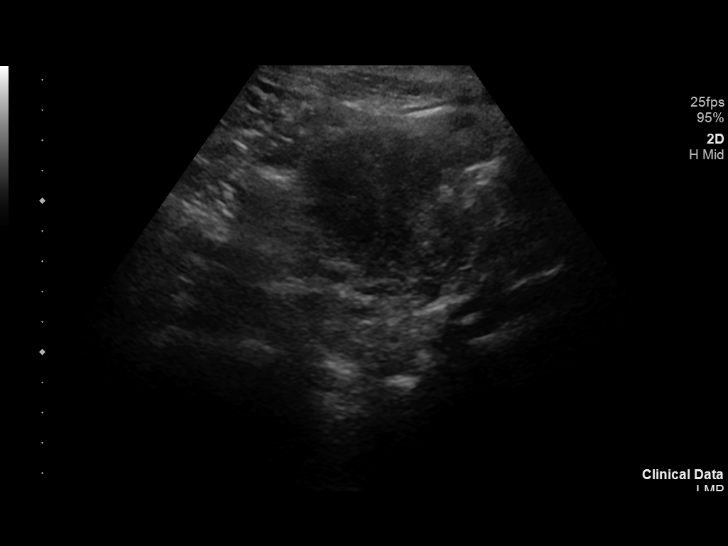
[im 7/78]
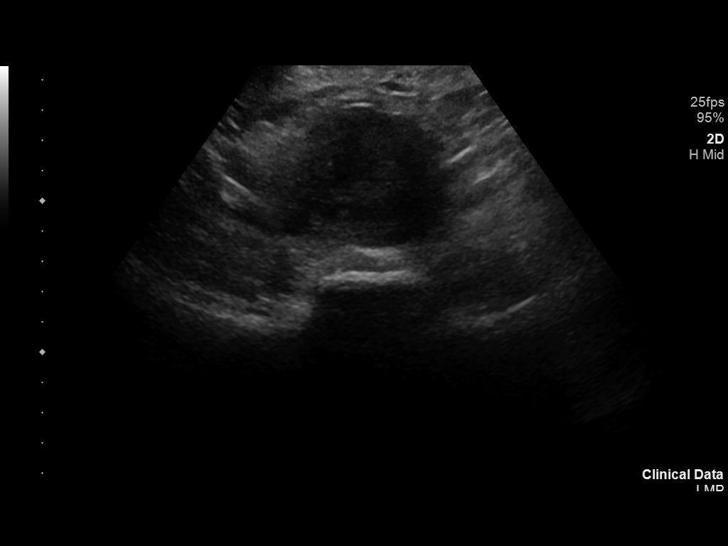
[im 13/78]
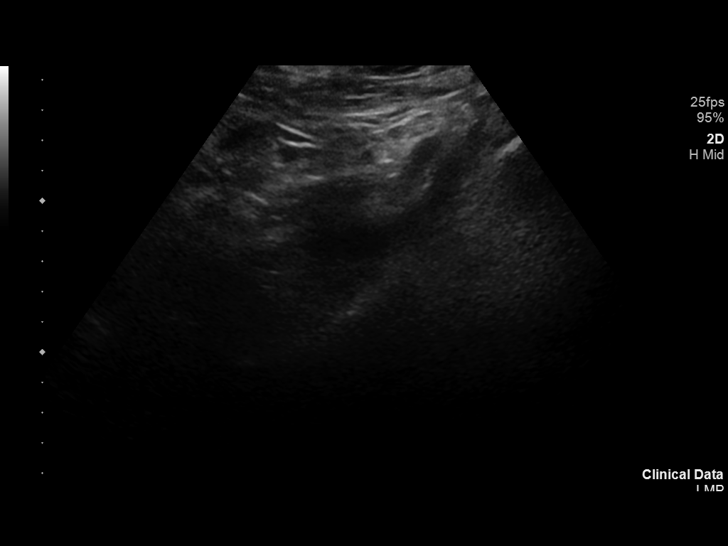
[im 20/78]
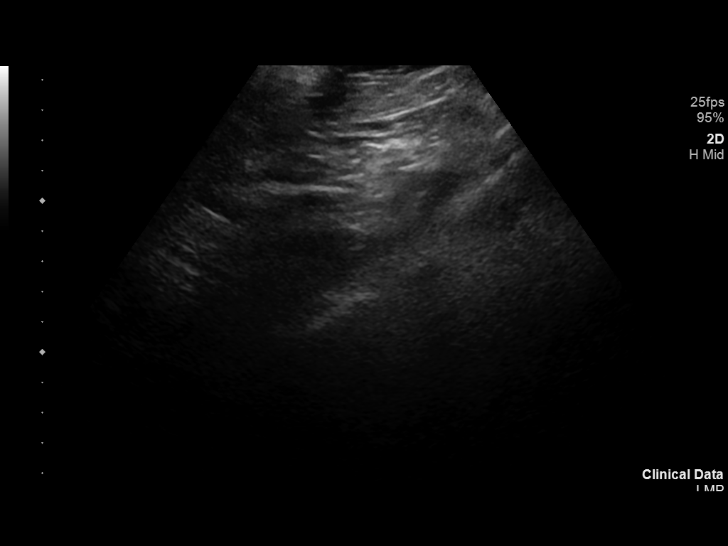
[im 26/78]
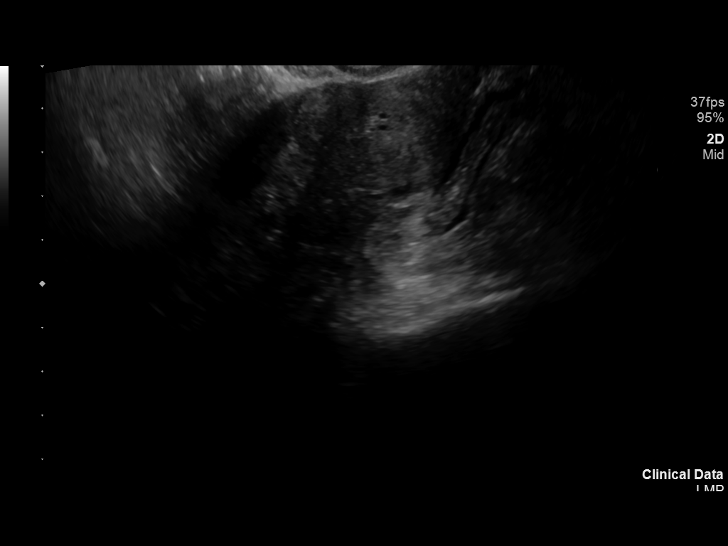
[im 33/78]
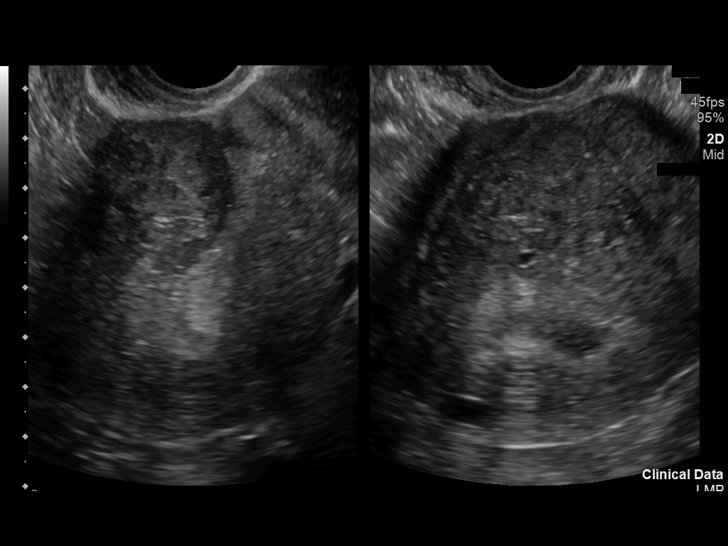
[im 39/78]
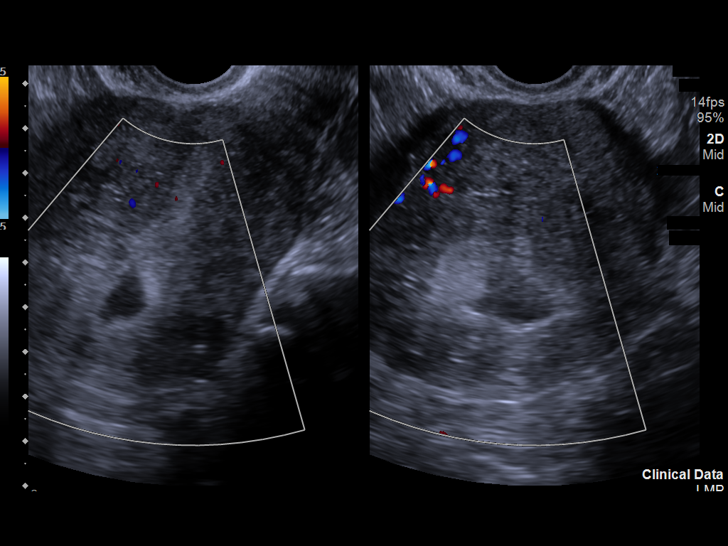
[im 45/78]
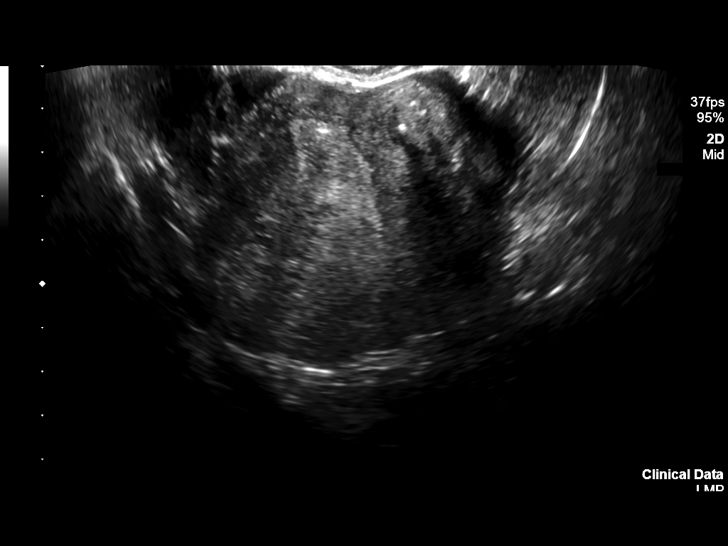
[im 52/78]
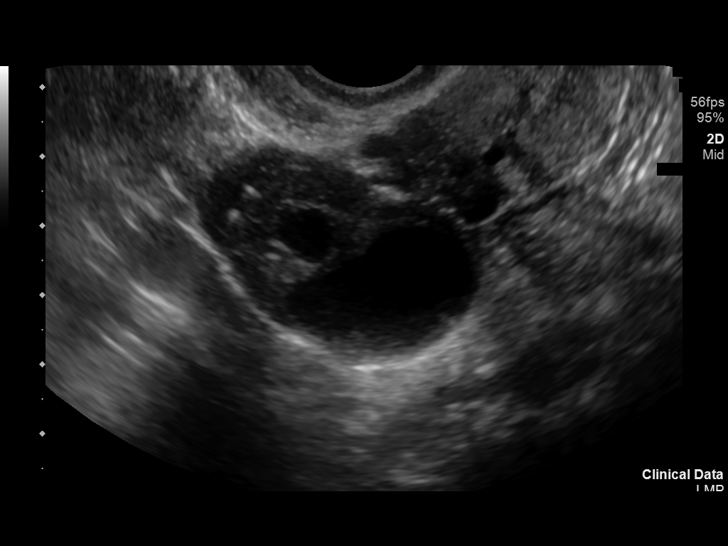
[im 58/78]
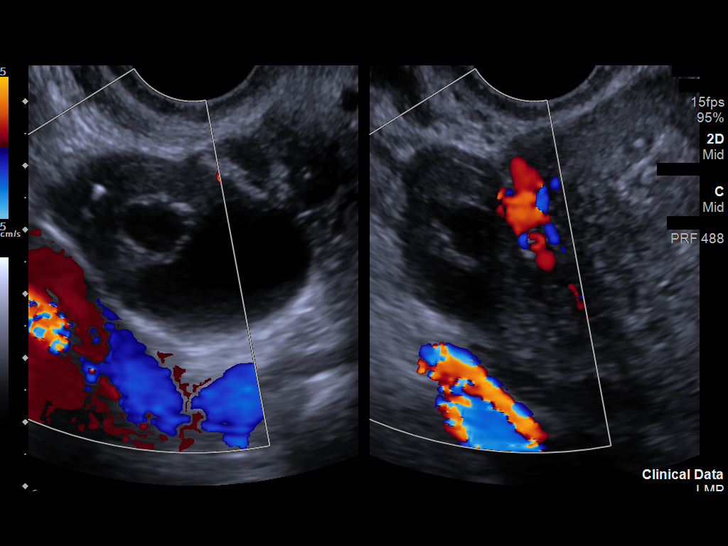
[im 65/78]
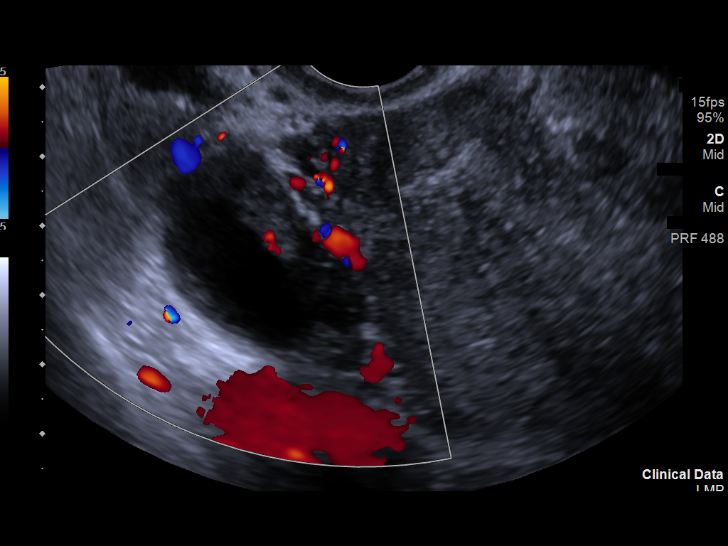
[im 71/78]
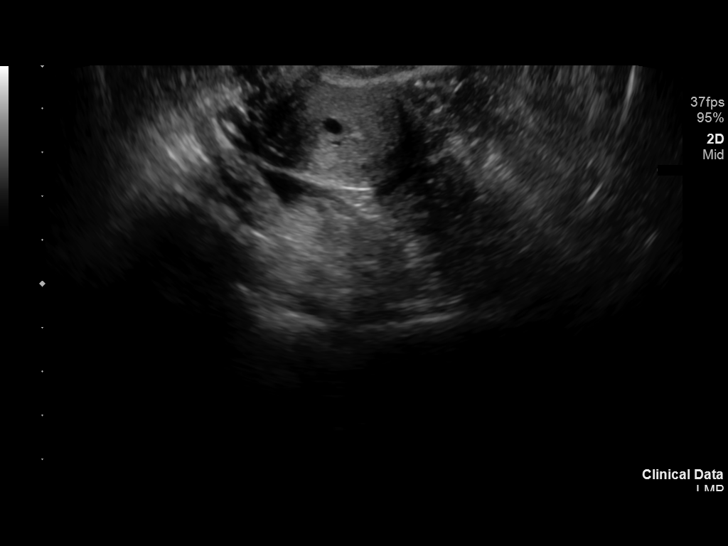
[im 78/78]
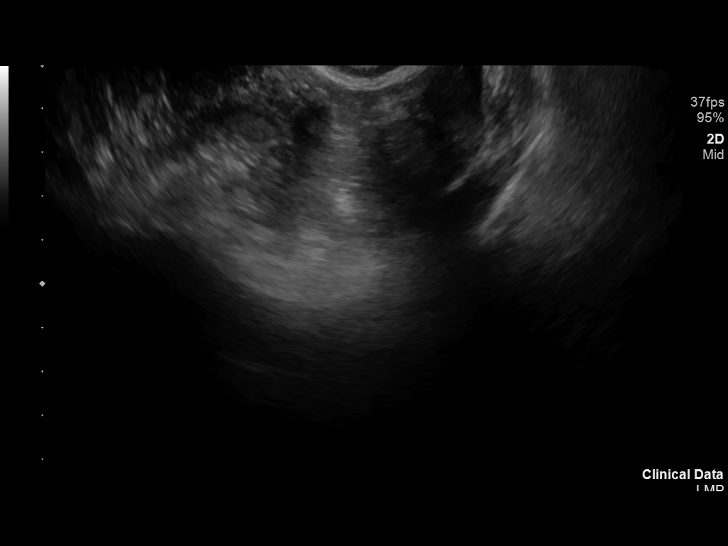

[13 of 25 positions shown; findings below may reference images not displayed]

FINDINGS: Uterus

Measurements: 11.1 x 6.2 x 7.4 cm = volume: 268 mL. 2.5 x 2.2 x
cm intramural fibroid in the left anterior fundus, decreased in size
since the prior study. 3.0 x 3.0 x 2.9 cm intramural fibroid in the
right posterior fundus, increased in size since the prior study.

Endometrium

Thickness: 19 mm. 1.1 x 1.0 x 1.9 cm round anechoic area associated
with the endometrium at the uterine fundus

Right ovary

Measurements: 4.0 x 2.6 x 2.1 cm = volume: 11 mL. 3.0 x 1.7 x 2.1 cm
simple cyst.

Left ovary

Not visualized.

Other findings

Trace free fluid in the pelvis.
IMPRESSION: 1. Thickened endometrium measuring 19 mm. In the setting of
post-menopausal bleeding, endometrial sampling is indicated to
exclude carcinoma. If results are benign, sonohysterogram should be
considered for focal lesion work-up. (Ref: Radiological Reasoning:
Algorithmic Workup of Abnormal Vaginal Bleeding with Endovaginal
Sonography and Sonohysterography. AJR [id number]; 191:S68-73)
2. 1.9 cm round anechoic area associated with the endometrium at the
uterine fundus could reflect a subendometrial cyst.
3. Two intramural fibroids again noted, one of which has mildly
increased in size since [DATE].
[DATE]. [DATE] cm benign simple cyst in the right ovary. No followup imaging
recommended. Note: This recommendation does not apply to
premenarchal patients or to those with increased risk (genetic,
family history, elevated tumor markers or other high-risk factors)
of ovarian cancer. Reference: Radiology [DATE]):[id number].

## 2023-01-04 ENCOUNTER — Encounter (HOSPITAL_BASED_OUTPATIENT_CLINIC_OR_DEPARTMENT_OTHER): Payer: Self-pay | Admitting: Emergency Medicine

## 2023-01-04 ENCOUNTER — Emergency Department (HOSPITAL_BASED_OUTPATIENT_CLINIC_OR_DEPARTMENT_OTHER)
Admission: EM | Admit: 2023-01-04 | Discharge: 2023-01-04 | Disposition: A | Discharge status: Home / Self Care | Payer: Commercial Managed Care - HMO | Attending: Emergency Medicine | Admitting: Emergency Medicine

## 2023-01-04 ENCOUNTER — Other Ambulatory Visit (HOSPITAL_BASED_OUTPATIENT_CLINIC_OR_DEPARTMENT_OTHER): Payer: Self-pay

## 2023-01-04 ENCOUNTER — Emergency Department (HOSPITAL_BASED_OUTPATIENT_CLINIC_OR_DEPARTMENT_OTHER): Payer: Commercial Managed Care - HMO | Admitting: Radiology

## 2023-01-04 ENCOUNTER — Other Ambulatory Visit: Payer: Self-pay

## 2023-01-04 DIAGNOSIS — Z7902 Long term (current) use of antithrombotics/antiplatelets: Secondary | ICD-10-CM | POA: Insufficient documentation

## 2023-01-04 DIAGNOSIS — W1830XA Fall on same level, unspecified, initial encounter: Secondary | ICD-10-CM | POA: Insufficient documentation

## 2023-01-04 DIAGNOSIS — W19XXXA Unspecified fall, initial encounter: Secondary | ICD-10-CM

## 2023-01-04 DIAGNOSIS — Z7982 Long term (current) use of aspirin: Secondary | ICD-10-CM | POA: Insufficient documentation

## 2023-01-04 DIAGNOSIS — M25561 Pain in right knee: Secondary | ICD-10-CM

## 2023-01-04 DIAGNOSIS — Z7901 Long term (current) use of anticoagulants: Secondary | ICD-10-CM | POA: Insufficient documentation

## 2023-01-04 MED ORDER — ONDANSETRON 4 MG PO TBDP
8.0000 mg | ORAL_TABLET | Freq: Once | ORAL | Status: AC
  Administered 2023-01-04: 8 mg via ORAL
  Filled 2023-01-04: qty 2

## 2023-01-04 MED ORDER — HYDROCODONE-ACETAMINOPHEN 5-325 MG PO TABS
1.0000 | ORAL_TABLET | Freq: Once | ORAL | Status: AC
  Administered 2023-01-04: 1 via ORAL
  Filled 2023-01-04: qty 1

## 2023-01-04 MED ORDER — OXYCODONE HCL 5 MG PO TABS
5.0000 mg | ORAL_TABLET | Freq: Four times a day (QID) | ORAL | 0 refills | Status: AC | PRN
Start: 2023-01-04 — End: ?
  Filled 2023-01-04: qty 12, 3d supply, fill #0

## 2023-01-04 NOTE — ED Triage Notes (Signed)
Pt reports mechanical fall this morning, c/o RT anterior knee pain, decreased ROM, difficulty bearing wt. Endorses thinners. Denies Head trauma

## 2023-01-04 NOTE — Discharge Instructions (Addendum)
X-ray did not show any fractures.  You are given a knee immobilizer, and crutches and some pain medication to use for severe breakthrough pain.  For the most part I recommend you use Tylenol 1000 mg every 8 hours for pain control along with ibuprofen 600 mg every 6-8 hours.  If you have any concerning symptoms return to the emergency room.

## 2023-01-04 NOTE — ED Provider Notes (Signed)
[place] EMERGENCY DEPARTMENT AT [facility] Provider Note   CSN: [id number] Arrival date & time: 01/04/23  5643     History  Chief Complaint  Patient presents with   Fall    Jacqueline Hodges is a 57 y.o. female.  57 year old female presents today after a mechanical fall that occurred this morning.  She fell directly onto her right knee.  She is on anticoagulation.  Has been difficult for her to bear weight since then.  Denies any other injury.  No head injury.  No loss of consciousness.  The history is provided by the patient. No language interpreter was used.       Home Medications Prior to Admission medications   Medication Sig Start Date End Date Taking? Authorizing Provider  dapagliflozin propanediol (FARXIGA) 10 MG TABS tablet Take 10 mg by mouth daily. 11/03/22  Yes [provider]  losartan-hydrochlorothiazide (HYZAAR) 50-12.5 MG tablet Take 1 tablet by mouth daily. 11/01/22  Yes [provider]  amLODipine (NORVASC) 10 MG tablet Take 1 tablet (10 mg total) by mouth daily. 04/22/21   Nahser, Deloris Ping, MD  aspirin EC 81 MG tablet Take 1 tablet (81 mg total) by mouth daily. Swallow whole. 03/14/22   Nahser, Deloris Ping, MD  atorvastatin (LIPITOR) 80 MG tablet Take 1 tablet (80 mg total) by mouth daily. 04/22/21   Nahser, Deloris Ping, MD  carvedilol (COREG) 25 MG tablet Take 1 tablet (25 mg total) by mouth 2 (two) times daily. 07/15/21   Nahser, Deloris Ping, MD  clopidogrel (PLAVIX) 75 MG tablet Take 1 tablet (75 mg total) by mouth daily. 03/14/22   Nahser, Deloris Ping, MD  diclofenac Sodium (VOLTAREN) 1 % GEL SMARTSIG:2 Gram(s) Topical 4 Times Daily PRN 02/21/21   [provider]  EPINEPHrine 0.3 mg/0.3 mL IJ SOAJ injection Inject 0.3 mg into the muscle as needed for anaphylaxis. 11/06/22   Blue, Soijett A, PA-C  ezetimibe (ZETIA) 10 MG tablet Take 1 tablet (10 mg total) by mouth daily. 03/18/22   Nahser, Deloris Ping, MD  famotidine (PEPCID) 20 MG tablet Take 1  tablet (20 mg total) by mouth 2 (two) times daily. 11/06/22   Blue, Soijett A, PA-C  famotidine (PEPCID) 40 MG tablet Take 40 mg by mouth at bedtime. 04/13/21   [provider]  isosorbide mononitrate (IMDUR) 60 MG 24 hr tablet Take 1 tablet (60 mg total) by mouth daily. 03/14/22   Nahser, Deloris Ping, MD  lisinopril-hydrochlorothiazide (ZESTORETIC) 20-12.5 MG tablet TAKE 2 TABLETS BY MOUTH DAILY 07/11/22 07/11/23  Nahser, Deloris Ping, MD  metFORMIN (GLUCOPHAGE) 500 MG tablet Take 1 tablet (500 mg total) by mouth 2 (two) times daily with a meal. 02/04/21   Marjie Skiff E, PA-C  naproxen (NAPROSYN) 375 MG tablet Take 1 tablet twice daily as needed for sharp chest pain. 02/15/22   Molpus, John, MD  nitroGLYCERIN (NITROSTAT) 0.4 MG SL tablet Dissolve 1 tablet under the tongue every 5 minutes as needed for chest pain. Max of 3 doses, then 911. 03/14/22   Nahser, Deloris Ping, MD  predniSONE (DELTASONE) 20 MG tablet 3 tabs po daily x 3 days, then 2 tabs x 3 days, then 1.5 tabs x 3 days, then 1 tab x 3 days, then 0.5 tabs x 3 days 11/06/22   Blue, Soijett A, PA-C  rOPINIRole (REQUIP) 0.5 MG tablet Take 0.25 mg by mouth. 04/13/21   [provider]      Allergies    Lisinopril  Review of Systems   Review of Systems  Constitutional:  Negative for chills and fever.  Musculoskeletal:  Positive for arthralgias.  All other systems reviewed and are negative.   Physical Exam Updated Vital Signs BP (!) 202/103   Pulse 72   Temp 98.5 F (36.9 C) (Oral)   Resp 20   LMP 06/30/2017   SpO2 99%  Physical Exam Vitals and nursing note reviewed.  Constitutional:      General: She is not in acute distress.    Appearance: Normal appearance. She is not ill-appearing.  HENT:     Head: Normocephalic and atraumatic.     Nose: Nose normal.  Eyes:     Conjunctiva/sclera: Conjunctivae normal.  Pulmonary:     Effort: Pulmonary effort is normal. No respiratory distress.  Musculoskeletal:        General:  Tenderness (right knee) present. No deformity.     Cervical back: Normal range of motion.  Skin:    Findings: No rash.  Neurological:     Mental Status: She is alert.     ED Results / Procedures / Treatments   Labs (all labs ordered are listed, but only abnormal results are displayed) Labs Reviewed - No data to display  EKG None  Radiology No results found.  Procedures Procedures    Medications Ordered in ED Medications  ondansetron (ZOFRAN-ODT) disintegrating tablet 8 mg (8 mg Oral Given 01/04/23 1045)  HYDROcodone-acetaminophen (NORCO/VICODIN) 5-325 MG per tablet 1 tablet (1 tablet Oral Given 01/04/23 1044)    ED Course/ Medical Decision Making/ A&P                                 Medical Decision Making Amount and/or Complexity of Data Reviewed Radiology: ordered.  Risk Prescription drug management.   Patient states she takes Plavix.  She had a mechanical fall this morning.  No other injury.  Particularly no head injury.  Difficulty bearing weight since the fall. Right knee X-ray obtained.  Shows arthritis otherwise no acute bony abnormality.  Will provide knee immobilizer, crutches, Ortho follow-up.  Will give short course of pain medication to use for severe breakthrough pain.  Patient is agreeable.  Discharged in stable condition.   Final Clinical Impression(s) / ED Diagnoses Final diagnoses:  Acute pain of right knee  Fall, initial encounter    Rx / DC Orders ED Discharge Orders          Ordered    oxyCODONE (ROXICODONE) 5 MG immediate release tablet  Every 6 hours PRN        01/04/23 1253              Marita Kansas, PA-C 01/04/23 1254    Zadie Rhine, MD 01/04/23 1441

## 2023-01-09 ENCOUNTER — Other Ambulatory Visit: Payer: Self-pay

## 2023-01-09 ENCOUNTER — Emergency Department (HOSPITAL_BASED_OUTPATIENT_CLINIC_OR_DEPARTMENT_OTHER): Payer: Commercial Managed Care - HMO | Admitting: Radiology

## 2023-01-09 ENCOUNTER — Encounter (HOSPITAL_BASED_OUTPATIENT_CLINIC_OR_DEPARTMENT_OTHER): Payer: Self-pay | Admitting: *Deleted

## 2023-01-09 DIAGNOSIS — Z7902 Long term (current) use of antithrombotics/antiplatelets: Secondary | ICD-10-CM | POA: Diagnosis not present

## 2023-01-09 DIAGNOSIS — I2510 Atherosclerotic heart disease of native coronary artery without angina pectoris: Secondary | ICD-10-CM | POA: Diagnosis not present

## 2023-01-09 DIAGNOSIS — Z79899 Other long term (current) drug therapy: Secondary | ICD-10-CM | POA: Diagnosis not present

## 2023-01-09 DIAGNOSIS — Z7984 Long term (current) use of oral hypoglycemic drugs: Secondary | ICD-10-CM | POA: Insufficient documentation

## 2023-01-09 DIAGNOSIS — I10 Essential (primary) hypertension: Secondary | ICD-10-CM | POA: Insufficient documentation

## 2023-01-09 DIAGNOSIS — E119 Type 2 diabetes mellitus without complications: Secondary | ICD-10-CM | POA: Insufficient documentation

## 2023-01-09 DIAGNOSIS — R030 Elevated blood-pressure reading, without diagnosis of hypertension: Secondary | ICD-10-CM | POA: Diagnosis present

## 2023-01-09 DIAGNOSIS — Z7982 Long term (current) use of aspirin: Secondary | ICD-10-CM | POA: Insufficient documentation

## 2023-01-09 LAB — CBC
HCT: 38.9 % (ref 36.0–46.0)
Hemoglobin: 13.2 g/dL (ref 12.0–15.0)
MCH: 27.3 pg (ref 26.0–34.0)
MCHC: 33.9 g/dL (ref 30.0–36.0)
MCV: 80.5 fL (ref 80.0–100.0)
Platelets: 282 10*3/uL (ref 150–400)
RBC: 4.83 MIL/uL (ref 3.87–5.11)
RDW: 13.6 % (ref 11.5–15.5)
WBC: 8 10*3/uL (ref 4.0–10.5)
nRBC: 0 % (ref 0.0–0.2)

## 2023-01-09 LAB — BASIC METABOLIC PANEL
Anion gap: 8 (ref 5–15)
BUN: 22 mg/dL — ABNORMAL HIGH (ref 6–20)
CO2: 25 mmol/L (ref 22–32)
Calcium: 10.1 mg/dL (ref 8.9–10.3)
Chloride: 103 mmol/L (ref 98–111)
Creatinine, Ser: 1.12 mg/dL — ABNORMAL HIGH (ref 0.44–1.00)
GFR, Estimated: 57 mL/min — ABNORMAL LOW (ref >60)
Glucose, Bld: 145 mg/dL — ABNORMAL HIGH (ref 70–99)
Potassium: 3.6 mmol/L (ref 3.5–5.1)
Sodium: 136 mmol/L (ref 135–145)

## 2023-01-09 LAB — TROPONIN I (HIGH SENSITIVITY): Troponin I (High Sensitivity): 4 ng/L (ref <18)

## 2023-01-09 NOTE — ED Triage Notes (Signed)
Pt is here to have her BP checked due to having some CP last pm and HA today. Pt is taking BP meds as prescribed.

## 2023-01-10 ENCOUNTER — Emergency Department (HOSPITAL_BASED_OUTPATIENT_CLINIC_OR_DEPARTMENT_OTHER)
Admission: EM | Admit: 2023-01-10 | Discharge: 2023-01-10 | Disposition: A | Discharge status: Home / Self Care | Payer: Commercial Managed Care - HMO | Attending: Emergency Medicine | Admitting: Emergency Medicine

## 2023-01-10 DIAGNOSIS — I160 Hypertensive urgency: Secondary | ICD-10-CM

## 2023-01-10 LAB — TROPONIN I (HIGH SENSITIVITY): Troponin I (High Sensitivity): 4 ng/L (ref <18)

## 2023-01-10 MED ORDER — CLONIDINE HCL 0.1 MG PO TABS: 0.1000 mg | ORAL_TABLET | Freq: Two times a day (BID) | ORAL | 0 refills | Status: DC | PRN

## 2023-01-10 NOTE — Discharge Instructions (Addendum)
Begin taking clonidine as prescribed as needed for blood pressures greater than 190 systolic.  Keep a record of your blood pressures and follow-up with your primary doctor to review in 1 week.

## 2023-01-10 NOTE — ED Provider Notes (Signed)
[place] EMERGENCY DEPARTMENT AT [facility] Provider Note   CSN: [id number] Arrival date & time: 01/09/23  2106     History  Chief Complaint  Patient presents with   Hypertension    Francis Guldin is a 57 y.o. female.  Patient is a 57 year old female with history of hypertension, coronary artery disease, type 2 diabetes.  Patient presenting today with complaints of elevated blood pressure.  She has been checking her blood pressure at home and getting readings of approximately 180 systolic.  She reports being compliant with her blood pressure medication.  She denies any shortness of breath, nausea.  She does describe feeling some chest tightness earlier today.  This seemed to improved after taking nitroglycerin.  She has no stents.  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  The history is provided by the patient.       Home Medications Prior to Admission medications   Medication Sig Start Date End Date Taking? Authorizing Provider  amLODipine (NORVASC) 10 MG tablet Take 1 tablet (10 mg total) by mouth daily. 04/22/21   Nahser, Deloris Ping, MD  aspirin EC 81 MG tablet Take 1 tablet (81 mg total) by mouth daily. Swallow whole. 03/14/22   Nahser, Deloris Ping, MD  atorvastatin (LIPITOR) 80 MG tablet Take 1 tablet (80 mg total) by mouth daily. 04/22/21   Nahser, Deloris Ping, MD  carvedilol (COREG) 25 MG tablet Take 1 tablet (25 mg total) by mouth 2 (two) times daily. 07/15/21   Nahser, Deloris Ping, MD  clopidogrel (PLAVIX) 75 MG tablet Take 1 tablet (75 mg total) by mouth daily. 03/14/22   Nahser, Deloris Ping, MD  dapagliflozin propanediol (FARXIGA) 10 MG TABS tablet Take 10 mg by mouth daily. 11/03/22   [provider]  diclofenac Sodium (VOLTAREN) 1 % GEL SMARTSIG:2 Gram(s) Topical 4 Times Daily PRN 02/21/21   [provider]  EPINEPHrine 0.3 mg/0.3 mL IJ SOAJ injection Inject 0.3 mg into the muscle as needed for anaphylaxis. 11/06/22   Blue, Soijett A,  PA-C  ezetimibe (ZETIA) 10 MG tablet Take 1 tablet (10 mg total) by mouth daily. 03/18/22   Nahser, Deloris Ping, MD  famotidine (PEPCID) 20 MG tablet Take 1 tablet (20 mg total) by mouth 2 (two) times daily. 11/06/22   Blue, Soijett A, PA-C  famotidine (PEPCID) 40 MG tablet Take 40 mg by mouth at bedtime. 04/13/21   [provider]  isosorbide mononitrate (IMDUR) 60 MG 24 hr tablet Take 1 tablet (60 mg total) by mouth daily. 03/14/22   Nahser, Deloris Ping, MD  lisinopril-hydrochlorothiazide (ZESTORETIC) 20-12.5 MG tablet TAKE 2 TABLETS BY MOUTH DAILY 07/11/22 07/11/23  Nahser, Deloris Ping, MD  losartan-hydrochlorothiazide (HYZAAR) 50-12.5 MG tablet Take 1 tablet by mouth daily. 11/01/22   [provider]  metFORMIN (GLUCOPHAGE) 500 MG tablet Take 1 tablet (500 mg total) by mouth 2 (two) times daily with a meal. 02/04/21   Marjie Skiff E, PA-C  naproxen (NAPROSYN) 375 MG tablet Take 1 tablet twice daily as needed for sharp chest pain. 02/15/22   Molpus, John, MD  nitroGLYCERIN (NITROSTAT) 0.4 MG SL tablet Dissolve 1 tablet under the tongue every 5 minutes as needed for chest pain. Max of 3 doses, then 911. 03/14/22   Nahser, Deloris Ping, MD  oxyCODONE (ROXICODONE) 5 MG immediate release tablet Take 1 tablet (5 mg total) by mouth every 6 (six) hours as needed for severe pain. 01/04/23   Marita Kansas, PA-C  predniSONE (DELTASONE) 20 MG tablet 3 tabs po daily x 3 days, then 2 tabs x 3 days, then 1.5 tabs x 3 days, then 1 tab x 3 days, then 0.5 tabs x 3 days 11/06/22   Blue, Soijett A, PA-C  rOPINIRole (REQUIP) 0.5 MG tablet Take 0.25 mg by mouth. 04/13/21   [provider]      Allergies    Lisinopril    Review of Systems   Review of Systems  All other systems reviewed and are negative.   Physical Exam Updated Vital Signs BP (!) 186/94   Pulse 83   Temp 99.1 F (37.3 C) (Oral)   Resp (!) 27   LMP 06/30/2017   SpO2 96%  Physical Exam Vitals and nursing note reviewed.  Constitutional:       General: She is not in acute distress.    Appearance: She is well-developed. She is not diaphoretic.  HENT:     Head: Normocephalic and atraumatic.  Eyes:     Extraocular Movements: Extraocular movements intact.     Pupils: Pupils are equal, round, and reactive to light.  Cardiovascular:     Rate and Rhythm: Normal rate and regular rhythm.     Heart sounds: No murmur heard.    No friction rub. No gallop.  Pulmonary:     Effort: Pulmonary effort is normal. No respiratory distress.     Breath sounds: Normal breath sounds. No wheezing.  Abdominal:     General: Bowel sounds are normal. There is no distension.     Palpations: Abdomen is soft.     Tenderness: There is no abdominal tenderness.  Musculoskeletal:        General: Normal range of motion.     Cervical back: Normal range of motion and neck supple.  Skin:    General: Skin is warm and dry.  Neurological:     General: No focal deficit present.     Mental Status: She is alert and oriented to person, place, and time.     Cranial Nerves: No cranial nerve deficit.     ED Results / Procedures / Treatments   Labs (all labs ordered are listed, but only abnormal results are displayed) Labs Reviewed  BASIC METABOLIC PANEL - Abnormal; Notable for the following components:      Result Value   Glucose, Bld 145 (*)    BUN 22 (*)    Creatinine, Ser 1.12 (*)    GFR, Estimated 57 (*)    All other components within normal limits  CBC  TROPONIN I (HIGH SENSITIVITY)  TROPONIN I (HIGH SENSITIVITY)    EKG None  Radiology DG Chest 2 View  Result Date: 01/09/2023 CLINICAL DATA:  Hypertension chest pain EXAM: CHEST - 2 VIEW COMPARISON:  02/15/2022 FINDINGS: The heart size and mediastinal contours are within normal limits. Both lungs are clear. The visualized skeletal structures are unremarkable. IMPRESSION: No active cardiopulmonary disease. Electronically Signed   By: Jasmine Pang M.D.   On: 01/09/2023 23:06     Procedures Procedures    Medications Ordered in ED Medications - No data to display  ED Course/ Medical Decision Making/ A&P  Patient presenting here with elevated blood pressure as described in the HPI.  Patient arrives here with stable vital signs and is afebrile.  Initial blood pressure is 180/80.  Patient is neurologically intact and physical examination unremarkable.  Workup initiated including CBC, metabolic panel, and troponin x 2, all of which are unremarkable.  Chest x-ray  shows no acute process.  Patient's blood pressure has been remaining in the 180 range.  I do not feel as though this requires intervention.  I will have patient watch her blood pressures at home.  If her blood pressure increases, I will prescribe a small quantity of clonidine for her to have on hand.  She is to follow-up with her primary doctor next week.  Final Clinical Impression(s) / ED Diagnoses Final diagnoses:  None    Rx / DC Orders ED Discharge Orders     None         Geoffery Lyons, MD 01/10/23 [phone]

## 2023-01-12 ENCOUNTER — Telehealth: Payer: Self-pay | Admitting: *Deleted

## 2023-01-12 NOTE — Telephone Encounter (Signed)
   Pre-operative Risk Assessment    Patient Name: Regena Delucchi  DOB: [birth year] MRN: [id number]    DATE OF LAST VISIT: 03/14/22 DR. NASHER DATE OF NEXT VISIT: NONE  Request for Surgical Clearance    Procedure:   RIGHT TOTAL KNEE ARTHROPLASTY  Date of Surgery:  Clearance TBD                                 Surgeon:  DR. DANIEL MARCHWIANY Surgeon's Group or Practice Name:  [name] Phone number:  [phone] EXT [phone] ATTN: [name] Fax number:  [phone]   Type of Clearance Requested:   - Medical ; PLAVIX & ASA    Type of Anesthesia:  Spinal   Additional requests/questions:    [name]   01/12/2023, 1:03 PM

## 2023-01-12 NOTE — Telephone Encounter (Signed)
   Name: Jacqueline Hodges  DOB: [birth year]  MRN: [id number]  Primary Cardiologist: Kristeen Miss, MD   Preoperative team, please contact this patient and set up a phone call appointment for further preoperative risk assessment. Please obtain consent and complete medication review. Thank you for your help.  I confirm that guidance regarding antiplatelet and oral anticoagulation therapy has been completed and, if necessary, noted below.  Per office protocol, if patient is without any new symptoms or concerns at the time of their virtual visit, he/she may hold Plavix for 5 days prior to procedure. Please resume Plavix as soon as possible postprocedure, at the discretion of the surgeon.   I also confirmed the patient resides in the state of [place]. As per [facility] Medical Board telemedicine laws, the patient must reside in the state in which the provider is licensed.   [name], NP 01/12/2023, 2:14 PM [facility]

## 2023-01-12 NOTE — Telephone Encounter (Signed)
Left message to call back to set up tele pre op appt.  

## 2023-01-18 ENCOUNTER — Telehealth: Payer: Self-pay

## 2023-01-18 NOTE — Telephone Encounter (Signed)
  Patient Consent for Virtual Visit        Jacqueline Hodges has provided verbal consent on 01/18/2023 for a virtual visit (video or telephone).   CONSENT FOR VIRTUAL VISIT FOR:  Jacqueline Hodges  By participating in this virtual visit I agree to the following:  I hereby voluntarily request, consent and authorize [facility] and its employed or contracted physicians, physician assistants, nurse practitioners or other licensed health care professionals (the Practitioner), to provide me with telemedicine health care services (the "Services") as deemed necessary by the treating Practitioner. I acknowledge and consent to receive the Services by the Practitioner via telemedicine. I understand that the telemedicine visit will involve communicating with the Practitioner through live audiovisual communication technology and the disclosure of certain medical information by electronic transmission. I acknowledge that I have been given the opportunity to request an in-person assessment or other available alternative prior to the telemedicine visit and am voluntarily participating in the telemedicine visit.  I understand that I have the right to withhold or withdraw my consent to the use of telemedicine in the course of my care at any time, without affecting my right to future care or treatment, and that the Practitioner or I may terminate the telemedicine visit at any time. I understand that I have the right to inspect all information obtained and/or recorded in the course of the telemedicine visit and may receive copies of available information for a reasonable fee.  I understand that some of the potential risks of receiving the Services via telemedicine include:  Delay or interruption in medical evaluation due to technological equipment failure or disruption; Information transmitted may not be sufficient (e.g. poor resolution of images) to allow for appropriate medical decision making by the Practitioner;  and/or  In rare instances, security protocols could fail, causing a breach of personal health information.  Furthermore, I acknowledge that it is my responsibility to provide information about my medical history, conditions and care that is complete and accurate to the best of my ability. I acknowledge that Practitioner's advice, recommendations, and/or decision may be based on factors not within their control, such as incomplete or inaccurate data provided by me or distortions of diagnostic images or specimens that may result from electronic transmissions. I understand that the practice of medicine is not an exact science and that Practitioner makes no warranties or guarantees regarding treatment outcomes. I acknowledge that a copy of this consent can be made available to me via my patient portal [facility] MyChart), or I can request a printed copy by calling the office of [facility].    I understand that my insurance will be billed for this visit.   I have read or had this consent read to me. I understand the contents of this consent, which adequately explains the benefits and risks of the Services being provided via telemedicine.  I have been provided ample opportunity to ask questions regarding this consent and the Services and have had my questions answered to my satisfaction. I give my informed consent for the services to be provided through the use of telemedicine in my medical care

## 2023-01-18 NOTE — Telephone Encounter (Signed)
Patient has now been scheduled for preop tele visit. Consent given and patient agrees to appt date and time.

## 2023-02-01 ENCOUNTER — Ambulatory Visit: Payer: Managed Care, Other (non HMO) | Attending: Cardiovascular Disease

## 2023-02-01 DIAGNOSIS — Z01810 Encounter for preprocedural cardiovascular examination: Secondary | ICD-10-CM

## 2023-02-01 NOTE — Progress Notes (Signed)
Cardiology Office Note:  .   Date:  02/03/2023  ID:  [name], DOB 1965/12/18, MRN [id number] PCP: Linus Galas, NP  [facility] Providers Cardiologist: Dr. Elease Hashimoto }   History of Present Illness: .   Jacqueline Hodges is a 57 y.o. female for preoperative evaluation with complaints of hypertension urgency.  On evaluation today by Edd Fabian in preoperative clinic with telephone visit.  She is due to have a right total knee arthroplasty by Dr. Weber Cooks on date to be determined.  She states that she is no longer going to move forward with knee surgery until blood pressure is better controlled.   She had been seen in the emergency room on 01/10/2019 for with hypertensive urgency, with other history to include CAD and type 2 diabetes.  Blood pressure was found to be 180/80.  She was to follow-up with cardiology and take blood pressures at home and bring report with her to this office visit.   She comes today with continuing elevation in blood pressure.  She was unable to take blood pressures at home as her blood pressure machine is not working correctly.  She continues to have chest pressure, chest tightness, dizziness, and headaches with elevated blood pressures.  She complains of daytime somnolence and not sleeping well at night.  She awakens often sometimes with racing heart rate.  She admits to snoring.  ROS: As above otherwise negative  Studies Reviewed: Marland Kitchen   EKG Interpretation Date/Time:  Friday February 03 2023 09:31:18 EDT Ventricular Rate:  80 PR Interval:  192 QRS Duration:  80 QT Interval:  374 QTC Calculation: 431 R Axis:   -2  Text Interpretation: Normal sinus rhythm Inferior infarct , age undetermined When compared with ECG of 09-Jan-2023 21:33, Criteria for Anterior infarct are no longer Present T wave inversion less evident in Inferior leads Nonspecific T wave abnormality no longer evident in Anterolateral leads Confirmed by [name] [phone]) on  02/03/2023 11:30:40 AM    LHC 02/01/2021   Dist LAD lesion is 70% stenosed.   Mid RCA lesion is 25% stenosed.   STOP BANG Score 4.  Physical Exam:   VS:  BP (!) 144/100 (BP Location: Right Arm, Patient Position: Sitting, Cuff Size: Normal)   Pulse 80   Ht 5\' 5"  (1.651 m)   Wt 208 lb (94.3 kg)   LMP 06/30/2017   SpO2 97%   BMI 34.61 kg/m    Wt Readings from Last 3 Encounters:  02/03/23 208 lb (94.3 kg)  03/14/22 206 lb 6.4 oz (93.6 kg)  02/15/22 204 lb (92.5 kg)    GEN: Well nourished, well developed in no acute distress NECK: No JVD; No carotid bruits CARDIAC: RRR, no murmurs, rubs, gallops RESPIRATORY:  Clear to auscultation without rales, wheezing or rhonchi  ABDOMEN: Soft, non-tender, non-distended EXTREMITIES:  No edema; No deformity   ASSESSMENT AND PLAN: .    Uncontrolled hypertension: Likely multifactorial.  Stop bang score is 4 and therefore I will evaluate her for OSA.  I will also check renal artery ultrasound to evaluate for renal artery stenosis.  Will also check an Aldactone/renin level.  In the interim I will increase her losartan/HCTZ from 50/12.5 mg daily to 100/25 mg daily.  She will continue amlodipine 10 mg daily, carvedilol 25 mg twice daily.  She is also on clonidine 0.1 mg twice daily as needed.  She has not taken any.  Will have follow-up BMET to evaluate kidney function with higher dose.  2.  CAD: Left heart catheterization 2022 revealed coronary artery stenosis, distal LAD 70% with mid RCA 25%.  She continues on secondary prevention with blood pressure continue to be managed, statin therapy, would recommend purposeful exercise, and weight loss.  Continue Plavix and aspirin.  When blood pressure is elevated she does feel some chest pressure.  May need to consider cardiac PET scan.  3.  Hypercholesterolemia: Remains on Lipitor 80 mg daily.  Goal of LDL less than 70.  Most recent labs 03/14/2022 LDL 118, HDL 35, total cholesterol 161.  Will need to repeat this  on follow-up fasting unless completed by PCP.  4.  Diabetes: On metformin followed by PCP.   5.  Daytime somnolence: Scheduling home sleep study to rule out OSA.  STOP-BANG score 4  Signed, Bettey Mare. Liborio Nixon, ANP, AACC

## 2023-02-01 NOTE — Telephone Encounter (Signed)
Per pre op APP Edd Fabian, FNP today states the Jacqueline Hodges needs in office appt due to c/o SOB. Jacqueline Hodges has been scheduled to see [name], DNP at the [facility]  office as nothing at [facility] until mid Nov. Jacqueline Hodges agree to plan of care. I will update all parties involved.

## 2023-02-01 NOTE — Progress Notes (Signed)
Preop team, patient notes increased shortness of breath and recent emergency department for chest pain which appears to be related to hypertensive urgency.  She has been seen and evaluated by her PCP.  She will need a in office appointment for cardiovascular exam before we are able to provide preoperative cardiac recommendations.  Please schedule her for an office appointment.  Please contact requesting office and let them know that we will need to postpone her surgery.  Thank you for your help.  Thomasene Ripple. Grover Robinson NP-C     02/01/2023, 9:08 AM [facility] [address] Office [phone] Fax [phone]

## 2023-02-03 ENCOUNTER — Ambulatory Visit: Payer: Managed Care, Other (non HMO) | Attending: Adult Health | Admitting: Adult Health

## 2023-02-03 ENCOUNTER — Encounter: Payer: Self-pay | Admitting: Adult Health

## 2023-02-03 ENCOUNTER — Other Ambulatory Visit: Payer: Self-pay | Admitting: Adult Health

## 2023-02-03 VITALS — BP 144/100 | HR 80 | Ht 65.0 in | Wt 208.0 lb

## 2023-02-03 DIAGNOSIS — I25118 Atherosclerotic heart disease of native coronary artery with other forms of angina pectoris: Secondary | ICD-10-CM | POA: Diagnosis not present

## 2023-02-03 DIAGNOSIS — R079 Chest pain, unspecified: Secondary | ICD-10-CM

## 2023-02-03 DIAGNOSIS — E7800 Pure hypercholesterolemia, unspecified: Secondary | ICD-10-CM

## 2023-02-03 DIAGNOSIS — I10 Essential (primary) hypertension: Secondary | ICD-10-CM | POA: Diagnosis not present

## 2023-02-03 DIAGNOSIS — R400 Somnolence: Secondary | ICD-10-CM

## 2023-02-03 MED ORDER — LOSARTAN POTASSIUM-HCTZ 100-25 MG PO TABS: 1.0000 | ORAL_TABLET | Freq: Every day | ORAL | 3 refills | Status: AC

## 2023-02-03 NOTE — Patient Instructions (Signed)
Medication Instructions:  Increase Losartan / hydrochlorothiazide to 100/25 mg ( Take 1 Tablet Daily). *If you need a refill on your cardiac medications before your next appointment, please call your pharmacy*   Lab Work: BMET, Aldosteron Level If you have labs (blood work) drawn today and your tests are completely normal, you will receive your results only by: MyChart Message (if you have MyChart) OR A paper copy in the mail If you have any lab test that is abnormal or we need to change your treatment, we will call you to review the results.   Testing/Procedures: [address], [address]. Your physician has requested that you have a renal artery duplex. During this test, an ultrasound is used to evaluate blood flow to the kidneys. Allow one hour for this exam. Do not eat after midnight the day before and avoid carbonated beverages. Take your medications as you usually do.   Your physician has recommended that you have a sleep study. This test records several body functions during sleep, including: brain activity, eye movement, oxygen and carbon dioxide blood levels, heart rate and rhythm, breathing rate and rhythm, the flow of air through your mouth and nose, snoring, body muscle movements, and chest and belly movement.    Follow-Up: At [facility], you and your health needs are our priority.  As part of our continuing mission to provide you with exceptional heart care, we have created designated Provider Care Teams.  These Care Teams include your primary Cardiologist (physician) and Advanced Practice Providers (APPs -  Physician Assistants and Nurse Practitioners) who all work together to provide you with the care you need, when you need it.  We recommend signing up for the patient portal called "MyChart".  Sign up information is provided on this After Visit Summary.  MyChart is used to connect with patients for Virtual Visits (Telemedicine).  Patients are able to view  lab/test results, encounter notes, upcoming appointments, etc.  Non-urgent messages can be sent to your provider as well.   To learn more about what you can do with MyChart, go to [email or web address].    Your next appointment:   3-4 week(s)  Provider:   [name], DNP, ANP

## 2023-02-07 LAB — BASIC METABOLIC PANEL
BUN/Creatinine Ratio: 14 (ref 9–23)
BUN: 15 mg/dL (ref 6–24)
CO2: 26 mmol/L (ref 20–29)
Calcium: 9.5 mg/dL (ref 8.7–10.2)
Chloride: 103 mmol/L (ref 96–106)
Creatinine, Ser: 1.04 mg/dL — ABNORMAL HIGH (ref 0.57–1.00)
Glucose: 157 mg/dL — ABNORMAL HIGH (ref 70–99)
Potassium: 4.3 mmol/L (ref 3.5–5.2)
Sodium: 139 mmol/L (ref 134–144)
eGFR: 63 mL/min/{1.73_m2} (ref >59)

## 2023-02-07 LAB — ALDOSTERONE + RENIN ACTIVITY W/ RATIO
Aldos/Renin Ratio: 13.7 (ref 0.0–30.0)
Aldosterone: 8.9 ng/dL (ref 0.0–30.0)
Renin Activity, Plasma: 0.648 ng/mL/h (ref 0.167–5.380)

## 2023-02-10 ENCOUNTER — Ambulatory Visit (HOSPITAL_COMMUNITY): Payer: Managed Care, Other (non HMO)

## 2023-02-13 ENCOUNTER — Telehealth: Payer: Self-pay

## 2023-02-13 NOTE — Telephone Encounter (Signed)
Patient did speak to someone about her labs prior to me calling.

## 2023-02-13 NOTE — Telephone Encounter (Signed)
Pt is returning nurse call. Pt would like a call back.

## 2023-02-13 NOTE — Telephone Encounter (Addendum)
Called patient regarding results. Left message for patient to call office.----- Message from [name] sent at 02/08/2023 10:38 AM EDT ----- I have reviewed the labs. They are showing improvement in kidney function. Continue medications as directed. Aldosterone is normal.  This is good news concerning high blood pressure treatment.  No further testing concerning the BP.  KL

## 2023-02-13 NOTE — Telephone Encounter (Addendum)
Called patient regarding results. Patient had understanding of results.----- Message from [name] sent at 02/08/2023 10:38 AM EDT ----- I have reviewed the labs. They are showing improvement in kidney function. Continue medications as directed. Aldosterone is normal.  This is good news concerning high blood pressure treatment.  No further testing concerning the BP.  KL

## 2023-02-17 ENCOUNTER — Ambulatory Visit (HOSPITAL_COMMUNITY): Payer: Managed Care, Other (non HMO) | Attending: Cardiovascular Disease

## 2023-02-22 NOTE — Progress Notes (Deleted)
  Cardiology Office Note:  .   Date:  02/22/2023  ID:  Jacqueline Hodges, DOB 1965/11/12, MRN [id number] PCP: Linus Galas, NP  [facility] Providers Cardiologist:  Kristeen Miss, MD { }   History of Present Illness: .   Jacqueline Hodges is a 57 y.o. female with history of uncontrolled hypertension, last seen in the office on 02/03/2023 at which time losartan/HCTZ was increased to 100/25 mg daily, she was to continue amlodipine 10 mg daily, carvedilol 25 mg twice daily, and clonidine 0.1 mg twice daily as needed.  She was initially seen for preoperative cardiac clearance but was unable to be cleared due to hypertension.  She also has a history of CAD with coronary artery stenosis of the distal LAD at 70% with mild RCA stenosis to 25% she was to continue aspirin and Plavix.  Hyperlipidemia, and diabetes.  Patient was also scheduled for a home sleep study to rule out OSA in the setting of hypertension.  Sleep study has not yet been completed/resulted  ROS: ***  Studies Reviewed: .        *** EKG Interpretation Date/Time:    Ventricular Rate:    PR Interval:    QRS Duration:    QT Interval:    QTC Calculation:   R Axis:      Text Interpretation:      Physical Exam:   VS:  LMP 06/30/2017    Wt Readings from Last 3 Encounters:  02/03/23 208 lb (94.3 kg)  03/14/22 206 lb 6.4 oz (93.6 kg)  02/15/22 204 lb (92.5 kg)    GEN: Well nourished, well developed in no acute distress NECK: No JVD; No carotid bruits CARDIAC: ***RRR, no murmurs, rubs, gallops RESPIRATORY:  Clear to auscultation without rales, wheezing or rhonchi  ABDOMEN: Soft, non-tender, non-distended EXTREMITIES:  No edema; No deformity   ASSESSMENT AND PLAN: .   ***    {Are you ordering a CV Procedure (e.g. stress test, cath, DCCV, TEE, etc)?   Press F2        :[id number]}    Signed, Bettey Mare. Liborio Nixon, ANP, AACC

## 2023-02-24 ENCOUNTER — Ambulatory Visit: Payer: Managed Care, Other (non HMO) | Attending: Adult Health | Admitting: Adult Health

## 2023-03-17 ENCOUNTER — Other Ambulatory Visit: Payer: Self-pay | Admitting: Cardiovascular Disease

## 2023-03-17 DIAGNOSIS — E782 Mixed hyperlipidemia: Secondary | ICD-10-CM

## 2023-03-17 DIAGNOSIS — I25118 Atherosclerotic heart disease of native coronary artery with other forms of angina pectoris: Secondary | ICD-10-CM

## 2023-03-21 ENCOUNTER — Other Ambulatory Visit: Payer: Self-pay | Admitting: *Deleted

## 2023-03-21 DIAGNOSIS — E782 Mixed hyperlipidemia: Secondary | ICD-10-CM

## 2023-03-21 DIAGNOSIS — I25118 Atherosclerotic heart disease of native coronary artery with other forms of angina pectoris: Secondary | ICD-10-CM

## 2023-03-21 MED ORDER — CLOPIDOGREL BISULFATE 75 MG PO TABS: 75.0000 mg | ORAL_TABLET | Freq: Every day | ORAL | 3 refills | Status: DC

## 2023-03-27 NOTE — Progress Notes (Deleted)
  Cardiology Office Note:  .   Date:  03/27/2023  ID:  Jacqueline Hodges, DOB 1966/03/03, MRN [id number] PCP: Linus Galas, NP  [facility] Providers Cardiologist:  Kristeen Miss, MD { }   History of Present Illness: .   Jacqueline Hodges is a 57 y.o. female we are following for hypertension with hypertensive urgency evaluation in the emergency room in October 2024.  She does have a home blood pressure machine which is not working correctly.  She also did complaint of daytime somnolence and not sleeping well at night along with snoring.  And was scheduled for a home sleep study.  On last office visit blood pressure was elevated at 144/100.  Renal artery ultrasound was checked for renal artery stenosis and Aldactone and renin level was also ordered.  She was increased on losartan HCTZ from 50/12.5 mg daily to 100/25 mg daily and she was to continue amlodipine 10 mg daily and carvedilol 25 mg twice daily.  She was to take clonidine 0.1 mg twice daily as needed for elevated blood pressure.  Home sleep study was completed on 02/03/2023 but results are not yet released.  Aldosterone/renin were within normal limits.  Renal artery ultrasound has not yet been completed.  She is here for follow-up to evaluate response to medication.  ROS: ***  Studies Reviewed: .        *** EKG Interpretation Date/Time:    Ventricular Rate:    PR Interval:    QRS Duration:    QT Interval:    QTC Calculation:   R Axis:      Text Interpretation:      Physical Exam:   VS:  LMP 06/30/2017    Wt Readings from Last 3 Encounters:  02/03/23 208 lb (94.3 kg)  03/14/22 206 lb 6.4 oz (93.6 kg)  02/15/22 204 lb (92.5 kg)    GEN: Well nourished, well developed in no acute distress NECK: No JVD; No carotid bruits CARDIAC: ***RRR, no murmurs, rubs, gallops RESPIRATORY:  Clear to auscultation without rales, wheezing or rhonchi  ABDOMEN: Soft, non-tender, non-distended EXTREMITIES:  No edema; No deformity    ASSESSMENT AND PLAN: .   ***    {Are you ordering a CV Procedure (e.g. stress test, cath, DCCV, TEE, etc)?   Press F2        :[id number]}    Signed, Jacqueline Hodges, ANP, AACC

## 2023-04-04 ENCOUNTER — Ambulatory Visit: Payer: Managed Care, Other (non HMO) | Attending: Adult Health | Admitting: Adult Health

## 2023-05-11 ENCOUNTER — Ambulatory Visit (HOSPITAL_COMMUNITY)
Admission: RE | Admit: 2023-05-11 | Discharge: 2023-05-11 | Disposition: A | Discharge status: Home / Self Care | Payer: Commercial Managed Care - HMO | Source: Ambulatory Visit | Attending: Adult Health | Admitting: Adult Health

## 2023-05-11 DIAGNOSIS — I10 Essential (primary) hypertension: Secondary | ICD-10-CM | POA: Diagnosis present

## 2023-06-23 ENCOUNTER — Ambulatory Visit: Attending: Cardiovascular Disease | Admitting: Cardiovascular Disease

## 2023-06-23 ENCOUNTER — Telehealth: Payer: Self-pay | Admitting: *Deleted

## 2023-06-23 NOTE — Telephone Encounter (Signed)
   Pre-operative Risk Assessment    Patient Name: Jacqueline Hodges  DOB: 1965/11/03 MRN: [id number]   Date of last office visit: 02/03/23 [name], DNP Date of next office visit: 06/23/23 DR. NAHSER   Request for Surgical Clearance    Procedure:   RIGHT TOTAL KNEE ARTHROPLASTY  Date of Surgery:  Clearance TBD                                Surgeon:  DR. DANIEL MARCHWIANY Surgeon's Group or Practice Name:  [name] Phone number:  [phone] EXT [name] Fax number:  [phone]   Type of Clearance Requested:   - Medical  - Pharmacy:  Hold Aspirin and Clopidogrel (Plavix)     Type of Anesthesia:  Spinal   Additional requests/questions:    [name]   06/23/2023, 8:50 AM

## 2023-06-23 NOTE — Telephone Encounter (Signed)
 Preoperative team, patient has follow-up appointment with Dr. Elease Hashimoto today.  Please add preoperative cardiac evaluation to appointment notes.  Thank you for your help.  Thomasene Ripple. Kimra Kantor NP-C     06/23/2023, 9:07 AM [facility] [address] Office [phone] Fax [phone]

## 2023-06-23 NOTE — Telephone Encounter (Signed)
 Requesting office sent a duplicate request stating 2nd request. Original was sent to our office 01/12/23. Pt had appt with [name], DNP 02/03/23. I will forward to our preop APP to review if the pt was cleared.

## 2023-06-26 ENCOUNTER — Other Ambulatory Visit: Payer: Self-pay

## 2023-06-26 ENCOUNTER — Emergency Department (HOSPITAL_COMMUNITY)
Admission: EM | Admit: 2023-06-26 | Discharge: 2023-06-26 | Disposition: A | Discharge status: Home / Self Care | Attending: Emergency Medicine | Admitting: Emergency Medicine

## 2023-06-26 ENCOUNTER — Encounter: Payer: Self-pay | Admitting: Cardiovascular Disease

## 2023-06-26 ENCOUNTER — Encounter (HOSPITAL_COMMUNITY): Payer: Self-pay | Admitting: Emergency Medicine

## 2023-06-26 DIAGNOSIS — Z7984 Long term (current) use of oral hypoglycemic drugs: Secondary | ICD-10-CM | POA: Diagnosis not present

## 2023-06-26 DIAGNOSIS — R55 Syncope and collapse: Secondary | ICD-10-CM | POA: Insufficient documentation

## 2023-06-26 DIAGNOSIS — Z79899 Other long term (current) drug therapy: Secondary | ICD-10-CM | POA: Diagnosis not present

## 2023-06-26 DIAGNOSIS — D649 Anemia, unspecified: Secondary | ICD-10-CM | POA: Insufficient documentation

## 2023-06-26 DIAGNOSIS — I10 Essential (primary) hypertension: Secondary | ICD-10-CM | POA: Diagnosis not present

## 2023-06-26 DIAGNOSIS — Z7982 Long term (current) use of aspirin: Secondary | ICD-10-CM | POA: Insufficient documentation

## 2023-06-26 LAB — URINALYSIS, W/ REFLEX TO CULTURE (INFECTION SUSPECTED)
Bilirubin Urine: NEGATIVE
Glucose, UA: NEGATIVE mg/dL
Hgb urine dipstick: NEGATIVE
Ketones, ur: NEGATIVE mg/dL
Leukocytes,Ua: NEGATIVE
Nitrite: NEGATIVE
Protein, ur: NEGATIVE mg/dL
Specific Gravity, Urine: 1.011 (ref 1.005–1.030)
pH: 5 (ref 5.0–8.0)

## 2023-06-26 LAB — CBC WITH DIFFERENTIAL/PLATELET
Abs Immature Granulocytes: 0.06 10*3/uL (ref 0.00–0.07)
Basophils Absolute: 0 10*3/uL (ref 0.0–0.1)
Basophils Relative: 0 %
Eosinophils Absolute: 0.2 10*3/uL (ref 0.0–0.5)
Eosinophils Relative: 2 %
HCT: 32.6 % — ABNORMAL LOW (ref 36.0–46.0)
Hemoglobin: 11 g/dL — ABNORMAL LOW (ref 12.0–15.0)
Immature Granulocytes: 1 %
Lymphocytes Relative: 27 %
Lymphs Abs: 2.8 10*3/uL (ref 0.7–4.0)
MCH: 27.6 pg (ref 26.0–34.0)
MCHC: 33.7 g/dL (ref 30.0–36.0)
MCV: 81.7 fL (ref 80.0–100.0)
Monocytes Absolute: 0.7 10*3/uL (ref 0.1–1.0)
Monocytes Relative: 7 %
Neutro Abs: 6.5 10*3/uL (ref 1.7–7.7)
Neutrophils Relative %: 63 %
Platelets: 276 10*3/uL (ref 150–400)
RBC: 3.99 MIL/uL (ref 3.87–5.11)
RDW: 13 % (ref 11.5–15.5)
WBC: 10.3 10*3/uL (ref 4.0–10.5)
nRBC: 0 % (ref 0.0–0.2)

## 2023-06-26 LAB — COMPREHENSIVE METABOLIC PANEL
ALT: 27 U/L (ref 0–44)
AST: 22 U/L (ref 15–41)
Albumin: 3.2 g/dL — ABNORMAL LOW (ref 3.5–5.0)
Alkaline Phosphatase: 53 U/L (ref 38–126)
Anion gap: 8 (ref 5–15)
BUN: 14 mg/dL (ref 6–20)
CO2: 23 mmol/L (ref 22–32)
Calcium: 8.9 mg/dL (ref 8.9–10.3)
Chloride: 106 mmol/L (ref 98–111)
Creatinine, Ser: 1.09 mg/dL — ABNORMAL HIGH (ref 0.44–1.00)
GFR, Estimated: 59 mL/min — ABNORMAL LOW (ref >60)
Glucose, Bld: 175 mg/dL — ABNORMAL HIGH (ref 70–99)
Potassium: 3.5 mmol/L (ref 3.5–5.1)
Sodium: 137 mmol/L (ref 135–145)
Total Bilirubin: 0.2 mg/dL (ref 0.0–1.2)
Total Protein: 7.3 g/dL (ref 6.5–8.1)

## 2023-06-26 MED ORDER — SODIUM CHLORIDE 0.9 % IV BOLUS
1000.0000 mL | Freq: Once | INTRAVENOUS | Status: AC
  Administered 2023-06-26: 1000 mL via INTRAVENOUS

## 2023-06-26 NOTE — Discharge Instructions (Addendum)
 Stop taking your new blood pressure medication (losartan/hydrochlorothiazide).  Please follow up closely with your family doctor for medication adjustments.

## 2023-06-26 NOTE — ED Provider Notes (Signed)
 [place] EMERGENCY DEPARTMENT AT [facility] Provider Note   CSN: [id number] Arrival date & time: 06/26/23  0134     History  Chief Complaint  Patient presents with   Loss of Consciousness    Jacqueline Hodges is a 58 y.o. female.  The history is provided by the patient.  Loss of Consciousness Jacqueline Hodges is a 58 y.o. female who presents to the Emergency Department complaining of syncope.  She presents emergency department for evaluation following a syncopal episode that occurred at home.  She took her blood pressure medicine in the evening and then about 45 minutes later she got hot and dizzy and got up and walked into another room and passed out.  She is unclear how long she was unconscious.  She did not hurt herself.  On EMS arrival she was found to have a systolic blood pressure in the 90s that dropped to the 80s when she sat up.  She was sick about a week ago with nausea, cough, diarrhea and vomiting.  This is overall gone.  She does report some associated dysuria but no abdominal pain.  She did start on losartan HCTZ 100/25 1 week ago.     Home Medications Prior to Admission medications   Medication Sig Start Date End Date Taking? Authorizing Provider  amLODipine (NORVASC) 10 MG tablet Take 1 tablet (10 mg total) by mouth daily. 04/22/21   Nahser, Deloris Ping, MD  aspirin EC 81 MG tablet Take 1 tablet (81 mg total) by mouth daily. Swallow whole. 03/14/22   Nahser, Deloris Ping, MD  atorvastatin (LIPITOR) 80 MG tablet Take 1 tablet (80 mg total) by mouth daily. 04/22/21   Nahser, Deloris Ping, MD  carvedilol (COREG) 25 MG tablet Take 1 tablet (25 mg total) by mouth 2 (two) times daily. 07/15/21   Nahser, Deloris Ping, MD  cloNIDine (CATAPRES) 0.1 MG tablet Take 1 tablet (0.1 mg total) by mouth 2 (two) times daily as needed. 01/10/23   Geoffery Lyons, MD  clopidogrel (PLAVIX) 75 MG tablet Take 1 tablet (75 mg total) by mouth daily. 03/21/23   Nahser, Deloris Ping, MD  dapagliflozin propanediol  (FARXIGA) 10 MG TABS tablet Take 10 mg by mouth daily. 11/03/22   [provider]  diclofenac Sodium (VOLTAREN) 1 % GEL SMARTSIG:2 Gram(s) Topical 4 Times Daily PRN 02/21/21   [provider]  EPINEPHrine 0.3 mg/0.3 mL IJ SOAJ injection Inject 0.3 mg into the muscle as needed for anaphylaxis. 11/06/22   Blue, Soijett A, PA-C  ezetimibe (ZETIA) 10 MG tablet Take 1 tablet (10 mg total) by mouth daily. 03/18/22   Nahser, Deloris Ping, MD  famotidine (PEPCID) 20 MG tablet Take 1 tablet (20 mg total) by mouth 2 (two) times daily. Patient not taking: Reported on 02/03/2023 11/06/22   Blue, Soijett A, PA-C  famotidine (PEPCID) 40 MG tablet Take 40 mg by mouth at bedtime. 04/13/21   [provider]  isosorbide mononitrate (IMDUR) 60 MG 24 hr tablet Take 1 tablet (60 mg total) by mouth daily. 03/14/22   Nahser, Deloris Ping, MD  lisinopril-hydrochlorothiazide (ZESTORETIC) 20-12.5 MG tablet TAKE 2 TABLETS BY MOUTH DAILY Patient not taking: Reported on 01/18/2023 07/11/22 07/11/23  Nahser, Deloris Ping, MD  losartan-hydrochlorothiazide (HYZAAR) 100-25 MG tablet Take 1 tablet by mouth daily. 02/03/23   Jodelle Gross, NP  metFORMIN (GLUCOPHAGE) 500 MG tablet Take 1 tablet (500 mg total) by mouth 2 (two) times daily with a meal. 02/04/21   Corrin Parker,  PA-C  naproxen (NAPROSYN) 375 MG tablet Take 1 tablet twice daily as needed for sharp chest pain. 02/15/22   Molpus, John, MD  nitroGLYCERIN (NITROSTAT) 0.4 MG SL tablet Dissolve 1 tablet under the tongue every 5 minutes as needed for chest pain. Max of 3 doses, then 911. 03/14/22   Nahser, Deloris Ping, MD  oxyCODONE (ROXICODONE) 5 MG immediate release tablet Take 1 tablet (5 mg total) by mouth every 6 (six) hours as needed for severe pain. 01/04/23   Marita Kansas, PA-C  predniSONE (DELTASONE) 20 MG tablet 3 tabs po daily x 3 days, then 2 tabs x 3 days, then 1.5 tabs x 3 days, then 1 tab x 3 days, then 0.5 tabs x 3 days 11/06/22   Blue, Soijett A, PA-C   rOPINIRole (REQUIP) 0.5 MG tablet Take 0.25 mg by mouth. 04/13/21   [provider]      Allergies    Lisinopril    Review of Systems   Review of Systems  Cardiovascular:  Positive for syncope.  All other systems reviewed and are negative.   Physical Exam Updated Vital Signs BP (!) 122/94   Pulse 63   Temp 97.7 F (36.5 C) (Oral)   Resp (!) 21   LMP 06/30/2017   SpO2 98%  Physical Exam Vitals and nursing note reviewed.  Constitutional:      Appearance: She is well-developed.  HENT:     Head: Normocephalic and atraumatic.  Cardiovascular:     Rate and Rhythm: Normal rate and regular rhythm.     Heart sounds: No murmur heard. Pulmonary:     Effort: Pulmonary effort is normal. No respiratory distress.     Breath sounds: Normal breath sounds.  Abdominal:     Palpations: Abdomen is soft.     Tenderness: There is no abdominal tenderness. There is no guarding or rebound.  Musculoskeletal:        General: No tenderness.  Skin:    General: Skin is warm and dry.  Neurological:     Mental Status: She is alert and oriented to person, place, and time.     Comments: 5 out of 5 strength in all 4 extremities  Psychiatric:        Behavior: Behavior normal.     ED Results / Procedures / Treatments   Labs (all labs ordered are listed, but only abnormal results are displayed) Labs Reviewed  COMPREHENSIVE METABOLIC PANEL - Abnormal; Notable for the following components:      Result Value   Glucose, Bld 175 (*)    Creatinine, Ser 1.09 (*)    Albumin 3.2 (*)    GFR, Estimated 59 (*)    All other components within normal limits  CBC WITH DIFFERENTIAL/PLATELET - Abnormal; Notable for the following components:   Hemoglobin 11.0 (*)    HCT 32.6 (*)    All other components within normal limits  URINALYSIS, W/ REFLEX TO CULTURE (INFECTION SUSPECTED) - Abnormal; Notable for the following components:   APPearance HAZY (*)    Bacteria, UA RARE (*)    All other components  within normal limits    EKG EKG Interpretation Date/Time:  Monday June 26 2023 03:51:48 EDT Ventricular Rate:  62 PR Interval:  189 QRS Duration:  112 QT Interval:  443 QTC Calculation: 450 R Axis:   19  Text Interpretation: Sinus rhythm Borderline intraventricular conduction delay Low voltage, precordial leads Borderline T abnormalities, diffuse leads Confirmed by Tilden Fossa [phone]) on 06/26/2023 3:55:17 AM  Radiology No results found.  Procedures Procedures    Medications Ordered in ED Medications  sodium chloride 0.9 % bolus 1,000 mL (0 mLs Intravenous Stopped 06/26/23 0551)    ED Course/ Medical Decision Making/ A&P                                 Medical Decision Making Amount and/or Complexity of Data Reviewed Labs: ordered.   Patient with history of hypertension here for evaluation following a syncopal event after she took her blood pressure medicine.  She just started taking losartan/HCTZ 1 week ago.  She does often feel fatigued and unwell after taking her blood pressure medications, which is when she takes them at night.  No associated chest pain or respiratory symptoms.  She did have hypotension for EMS, improved after IV fluids.  BMP with stable renal function.  CBC with mild anemia-no hematochezia or melena.  Current clinical picture is not consistent with PE, cardiogenic syncope.  EKG is stable when compared to priors.  Patient feels improved after IV fluids in the emergency department.  Recommend at this point that she discontinue her losartan HCTZ and follow-up closely with PCP.        Final Clinical Impression(s) / ED Diagnoses Final diagnoses:  Syncope and collapse    Rx / DC Orders ED Discharge Orders     None         Tilden Fossa, MD 06/26/23 [phone]

## 2023-06-26 NOTE — ED Triage Notes (Signed)
 Pt BIBA from home, reports she's been sick recently- abd n/v/d x1 wk, feels dehydrated, on HTN meds. Had syncopal episode about after taking HTN meds. Hx of MI, denies CP or any other symptoms. EMS reports initial systolic bp in the 90's, dropped to 80's after pt sat up. 500 NS bolus given PTA.

## 2023-07-24 ENCOUNTER — Ambulatory Visit: Admitting: Internal Medicine

## 2023-07-24 NOTE — Progress Notes (Deleted)
 NEW PATIENT Date of Service/Encounter:  07/24/23 Referring provider: Linus Galas, NP Primary care provider: Linus Galas, NP  Subjective:  Jacqueline Hodges is a 58 y.o. female with a PMHx of HTN, cervical radiculopathy, migraines, coronary artery disease, hyperlipidemia, type 2 diabetes, left thyroid nodule, pulmonary nodules, GERD, restless leg syndrome presenting today for evaluation of hives and swelling episodes. History obtained from: chart review and {Persons; PED relatives w/patient:19415::"patient"}.   Discussed the use of AI scribe software for clinical note transcription with the patient, who gave verbal consent to proceed.  History of Present Illness    Chart Review:  Reviewed PCP notes from referral 11/23/22: has had anaphylactic reaction x 2, unknown trigger. ED visit 11/06/22: past medical history of hypertension, diabetes who presents emergency department with concerns for allergic reaction onset last night. Notes that she noticed pruritic rash and lip swelling last night. Also noticed right-sided facial swelling last night. No meds tried prior to arrival. Initially noticed chest tightness, throat closing sensation. Denies nausea, vomiting, wheezing. Was unable to take her antihypertensives today.   Other allergy screening: Asthma: {Blank single:19197::"yes","no"} Rhino conjunctivitis: {Blank single:19197::"yes","no"} Food allergy: {Blank single:19197::"yes","no"} Medication allergy: {Blank single:19197::"yes","no"} Hymenoptera allergy: {Blank single:19197::"yes","no"} Urticaria: {Blank single:19197::"yes","no"} Eczema:{Blank single:19197::"yes","no"} History of recurrent infections suggestive of immunodeficency: {Blank single:19197::"yes","no"} ***Vaccinations are up to date.   Past Medical History: Past Medical History:  Diagnosis Date   Abnormal perimenopausal bleeding 02/20/2017   CAD in native artery    a. NSTEMI 02/2021 with 70% dLAD, 25% mRCA but dLAD felt of  uncertain significance, treated medically.   Cervical radiculopathy 01/12/2017   Diabetes mellitus (HCC)    Fibroid uterus 02/20/2017   Hyperlipidemia LDL goal <70    Hypertension    Migraine with aura and without status migrainosus, not intractable 01/12/2017   Precordial chest pain    Medication List:  Current Outpatient Medications  Medication Sig Dispense Refill   amLODipine (NORVASC) 10 MG tablet Take 1 tablet (10 mg total) by mouth daily. 90 tablet 3   aspirin EC 81 MG tablet Take 1 tablet (81 mg total) by mouth daily. Swallow whole. 90 tablet 3   atorvastatin (LIPITOR) 80 MG tablet Take 1 tablet (80 mg total) by mouth daily. 90 tablet 3   carvedilol (COREG) 25 MG tablet Take 1 tablet (25 mg total) by mouth 2 (two) times daily. 180 tablet 3   cloNIDine (CATAPRES) 0.1 MG tablet Take 1 tablet (0.1 mg total) by mouth 2 (two) times daily as needed. 30 tablet 0   clopidogrel (PLAVIX) 75 MG tablet Take 1 tablet (75 mg total) by mouth daily. 90 tablet 3   dapagliflozin propanediol (FARXIGA) 10 MG TABS tablet Take 10 mg by mouth daily.     diclofenac Sodium (VOLTAREN) 1 % GEL SMARTSIG:2 Gram(s) Topical 4 Times Daily PRN     EPINEPHrine 0.3 mg/0.3 mL IJ SOAJ injection Inject 0.3 mg into the muscle as needed for anaphylaxis. 1 each 0   ezetimibe (ZETIA) 10 MG tablet Take 1 tablet (10 mg total) by mouth daily. 90 tablet 3   famotidine (PEPCID) 20 MG tablet Take 1 tablet (20 mg total) by mouth 2 (two) times daily. (Patient not taking: Reported on 02/03/2023) 30 tablet 0   famotidine (PEPCID) 40 MG tablet Take 40 mg by mouth at bedtime.     isosorbide mononitrate (IMDUR) 60 MG 24 hr tablet Take 1 tablet (60 mg total) by mouth daily. 90 tablet 3   lisinopril-hydrochlorothiazide (ZESTORETIC) 20-12.5 MG tablet TAKE 2  TABLETS BY MOUTH DAILY (Patient not taking: Reported on 01/18/2023) 180 tablet 2   losartan-hydrochlorothiazide (HYZAAR) 100-25 MG tablet Take 1 tablet by mouth daily. 90 tablet 3    metFORMIN (GLUCOPHAGE) 500 MG tablet Take 1 tablet (500 mg total) by mouth 2 (two) times daily with a meal. 60 tablet 2   naproxen (NAPROSYN) 375 MG tablet Take 1 tablet twice daily as needed for sharp chest pain. 20 tablet 0   nitroGLYCERIN (NITROSTAT) 0.4 MG SL tablet Dissolve 1 tablet under the tongue every 5 minutes as needed for chest pain. Max of 3 doses, then 911. 25 tablet 6   oxyCODONE (ROXICODONE) 5 MG immediate release tablet Take 1 tablet (5 mg total) by mouth every 6 (six) hours as needed for severe pain. 12 tablet 0   predniSONE (DELTASONE) 20 MG tablet 3 tabs po daily x 3 days, then 2 tabs x 3 days, then 1.5 tabs x 3 days, then 1 tab x 3 days, then 0.5 tabs x 3 days 27 tablet 0   rOPINIRole (REQUIP) 0.5 MG tablet Take 0.25 mg by mouth.     No current facility-administered medications for this visit.   Known Allergies:  Allergies  Allergen Reactions   Lisinopril     Other Reaction(s): swelling/itching   Past Surgical History: Past Surgical History:  Procedure Laterality Date   CESAREAN SECTION     LEFT HEART CATH AND CORONARY ANGIOGRAPHY N/A 02/01/2021   Procedure: LEFT HEART CATH AND CORONARY ANGIOGRAPHY;  Surgeon: Runell Gess, MD;  Location: [facility] INVASIVE CV LAB;  Service: Cardiovascular;  Laterality: N/A;   Family History: Family History  Problem Relation Age of Onset   Heart failure Mother    Diabetes Mother    High Cholesterol Mother    Coronary artery disease Mother    Social History: [place] lives ***.   ROS:  All other systems negative except as noted per HPI.  Objective:  Last menstrual period 06/30/2017. There is no height or weight on file to calculate BMI. Physical Exam:  General Appearance:  Alert, cooperative, no distress, appears stated age  Head:  Normocephalic, without obvious abnormality, atraumatic  Eyes:  Conjunctiva clear, EOM's intact  Ears {Blank multiple:19196:a:"***","EACs normal bilaterally","normal TMs bilaterally","ear tubes  present bilaterally without exudate"}  Nose: Nares normal, {Blank multiple:19196:a:"***","hypertrophic turbinates","normal mucosa","no visible anterior polyps","septum midline"}  Throat: Lips, tongue normal; teeth and gums normal, {Blank multiple:19196:a:"***","normal posterior oropharynx","tonsils 2+","tonsils 3+","no tonsillar exudate","+ cobblestoning","surgically absent tonsils","mildly erythematous posterior oropharynx"}  Neck: Supple, symmetrical  Lungs:   {Blank multiple:19196:a:"***","clear to auscultation bilaterally","end-expiratory wheezing","wheezing throughout"}, Respirations unlabored, {Blank multiple:19196:a:"***","no coughing","intermittent dry coughing","intermittent productive-sounding cough"}  Heart:  {Blank multiple:19196:a:"***","regular rate and rhythm","no murmur"}, Appears well perfused  Extremities: No edema  Skin: {Blank multiple:19196:a:"***","erythematous, dry patches scattered on ***","lichenification on ***","Skin color, texture, turgor normal","no rashes or lesions on visualized portions of skin"}  Neurologic: No gross deficits   Diagnostics: Spirometry:  Tracings reviewed. Her effort: {Blank single:19197::"Good reproducible efforts.","It was hard to get consistent efforts and there is a question as to whether this reflects a maximal maneuver.","Poor effort, data can not be interpreted.","Variable effort-results affected","effort okay for first attempt at spirometry.","Results not reproducible due to ***"} FVC: ***L (pre), ***L  (post) FEV1: ***L, ***% predicted (pre), ***L, ***% predicted (post) FEV1/FVC ratio: *** (pre), *** (post) Interpretation: {Blank single:19197::"Spirometry consistent with mild obstructive disease","Spirometry consistent with moderate obstructive disease","Spirometry consistent with severe obstructive disease","Spirometry consistent with possible restrictive disease","Spirometry consistent with mixed obstructive and restrictive  disease","Spirometry uninterpretable due to [employer]  consistent with normal pattern","No overt abnormalities noted given today's efforts","Nonobstructive ratio, low FEV1","Nonobstructive ratio, low FEV1, possible restriction"}.  Please see scanned spirometry results for details.  Skin Testing: {Blank single:19197::"Select foods","Environmental allergy panel","Environmental allergy panel and select foods","Food allergy panel","None","Deferred due to recent antihistamines use","deferred due to recent reaction","Pediatric Environmental Allergy Panel","Pediatric Food Panel","Select foods and environmental allergies"}. {Blank single:19197::"Adequate positive and negative controls","Inadequate positive control-testing invalid","Adequate positive and negative controls, dermatographism present, testing difficult to interpret"}. Results discussed with patient/family.   {Blank single:19197::"Allergy testing results were read and interpreted by myself, documented by clinical staff.","Allergy testing results were read by ***,FNP, documented by clinical staff"}  Labs:  Lab Orders  No laboratory test(s) ordered today     Assessment and Plan  Assessment and Plan Assessment & Plan       {Blank single:19197::"This note in its entirety was forwarded to the Provider who requested this consultation."}  Other: {Blank multiple:19196:a:"***","samples provided of: ***","school forms provided","reviewed spirometry technique","reviewed inhaler technique"}  Thank you for your kind referral. I appreciate the opportunity to take part in Remona's care. Please do not hesitate to contact me with questions.***  Sincerely,  Jonathon Neighbors, MD [facility] 

## 2023-08-07 IMAGING — DX DG CHEST 2V
2 series · 2 of 2 positions shown · non-contrast
Comparison: 11/30/2017

CLINICAL DATA: Chest pain.

EXAM:
CHEST - 2 VIEW

[chest pa]
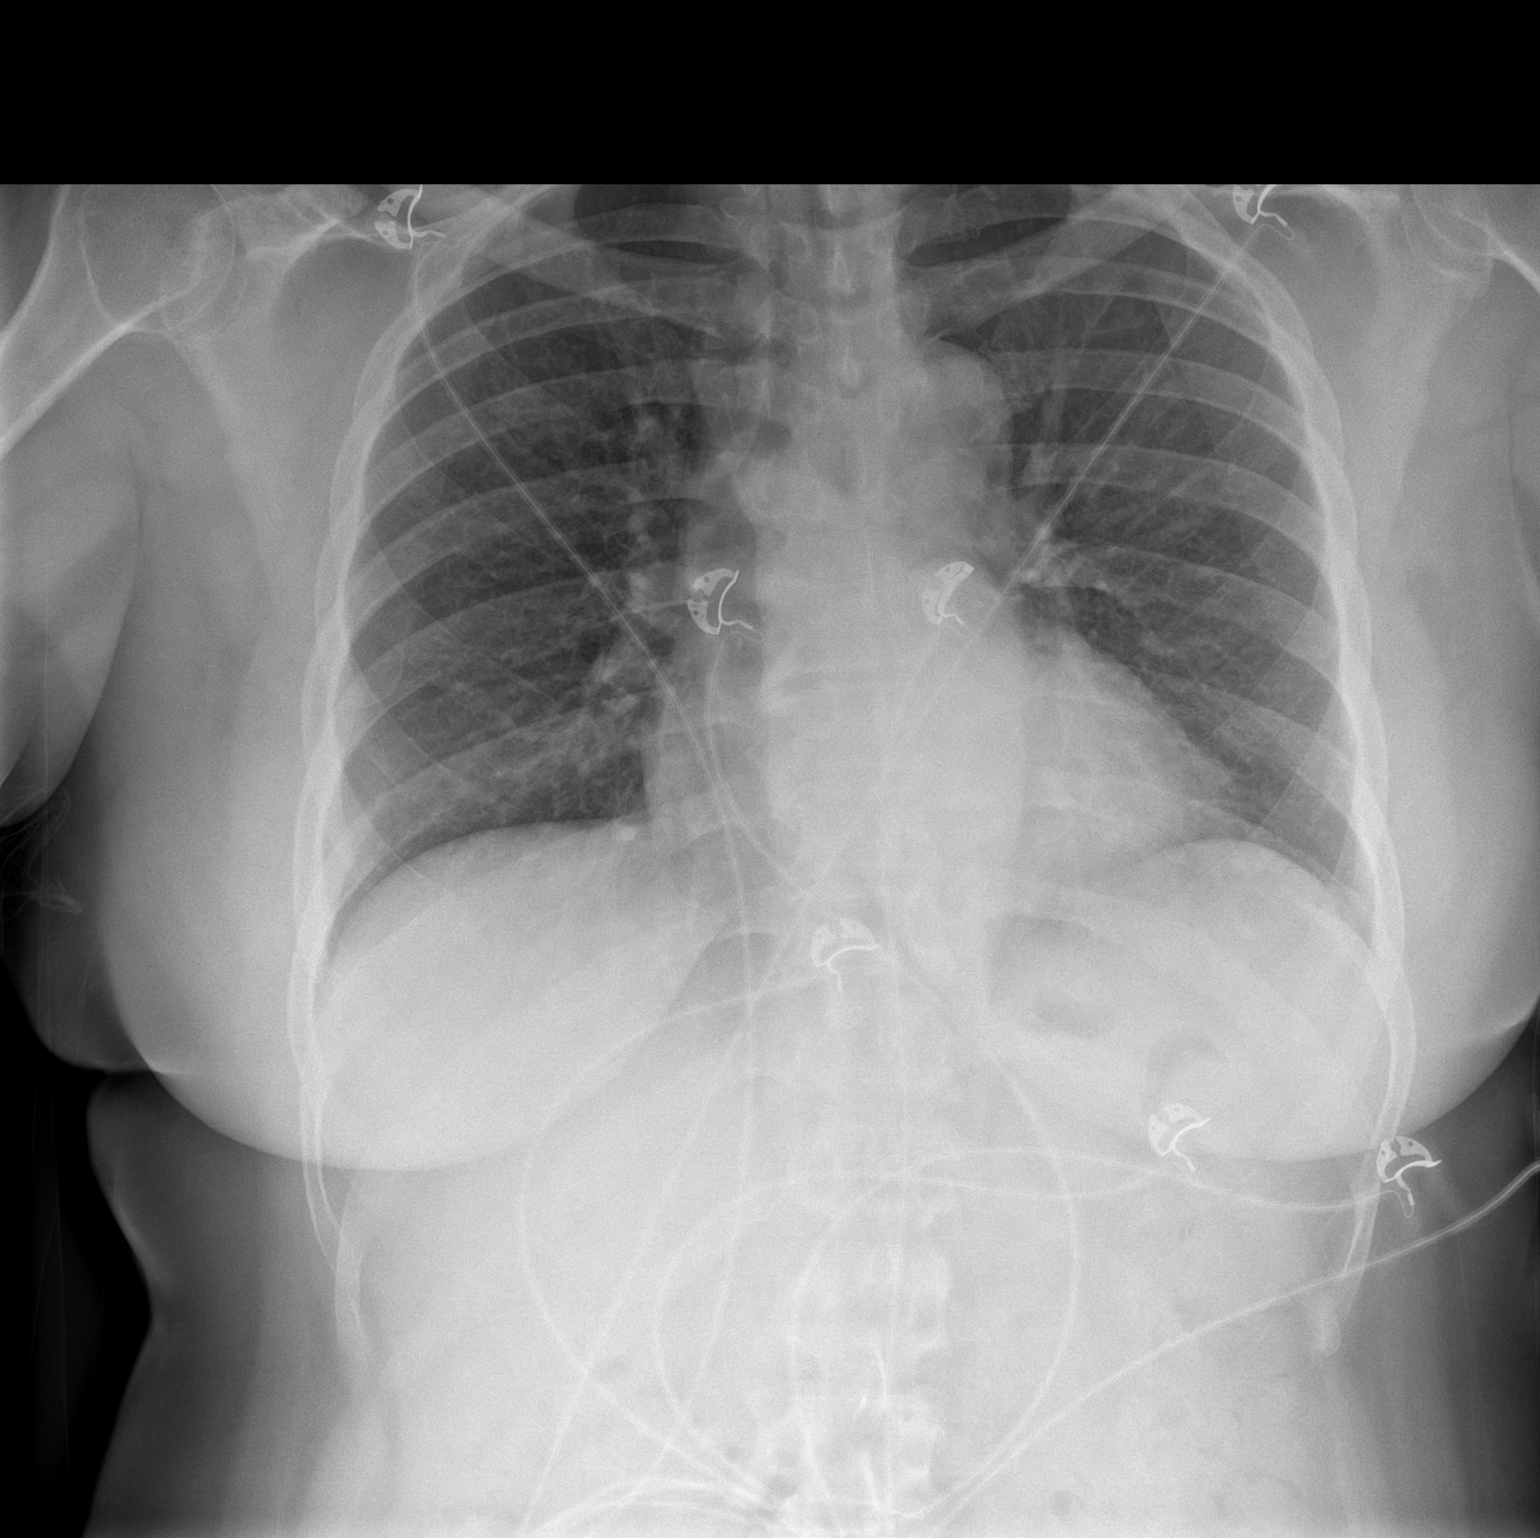

[chest lat]
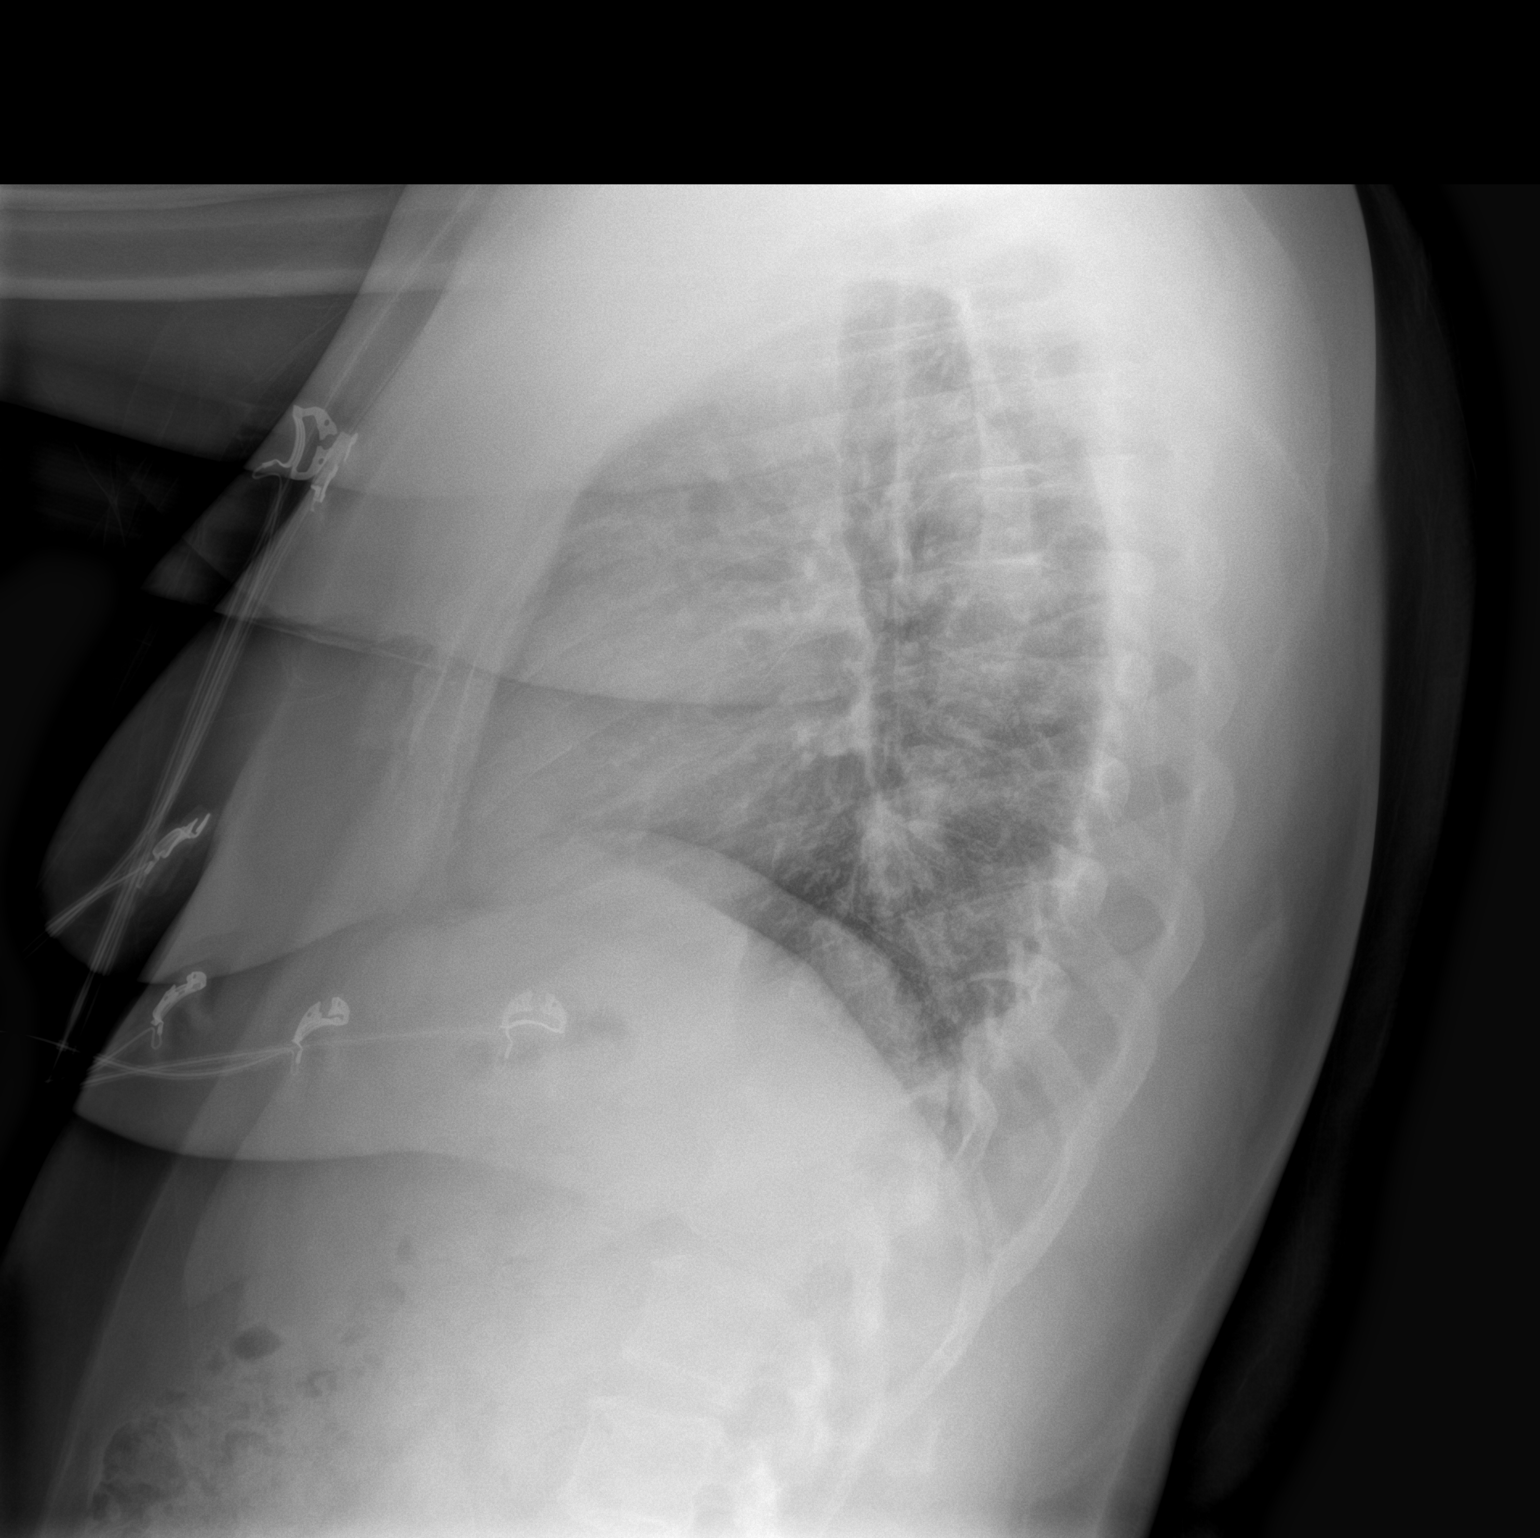

[2 of 2 positions shown; findings below may reference images not displayed]

FINDINGS: The heart size and mediastinal contours are within normal limits.
Both lungs are clear. The visualized skeletal structures are
unremarkable.
IMPRESSION: No active cardiopulmonary disease.

## 2023-09-08 ENCOUNTER — Telehealth: Payer: Self-pay | Admitting: Cardiovascular Disease

## 2023-09-08 NOTE — Telephone Encounter (Signed)
 Pt needs to take BP regularly.   Follow and bring in to appt

## 2023-09-08 NOTE — Telephone Encounter (Signed)
 Spoke with patient and she states since starting losartan  100 mg she has been feeling tired and having no energy she just want to sleep. She also feels that her arms be tingling and feel numb at times as well. She also had a syncope episode in March SBP dropped to below 80. She has been experiencing dizziness and lightheadedness. She also had blurred vision. Just this week BP was 90/55. Today she is asymptomatic.   She states when she does not take her BP medications she feels fine but her BP is elevated.   She is scheduled with APP. Continue to monitor BP and keep log.  ED precautions discussed.

## 2023-09-08 NOTE — Telephone Encounter (Signed)
 Pt c/o medication issue:  1. Name of Medication: Unsure of what cardiac med is the cause  2. How are you currently taking this medication (dosage and times per day)? States she is taking all medications as prescribed   3. Are you having a reaction (difficulty breathing--STAT)? Yes   4. What is your medication issue?  Patient reports when she takes her heart meds it brings her down, making her feel like all she wants to do is lay down and sleep. She also c/o tingling in hands occurring at times to the point of feeling they are going to go numb. She states when she urinates it burns her bottom as if she is being scald to the point of having to consistently wash the area after for relief. She reports her BP dropped down to 90/55 this week, either Monday or Wednesday. She states she had blurred vision when BP dropped. Per patient when she does not take the medications she can feel her BP getting too high. She denied any symptoms at time of call. Please advise.   An overdue f/u was scheduled for 06/17 prior to any symptoms being reported.

## 2023-09-26 ENCOUNTER — Encounter: Payer: Self-pay | Admitting: Physician Assistant

## 2023-09-26 ENCOUNTER — Ambulatory Visit: Attending: Physician Assistant | Admitting: Cardiology

## 2023-09-26 VITALS — BP 140/78 | HR 73 | Ht 65.0 in | Wt 208.8 lb

## 2023-09-26 DIAGNOSIS — I25118 Atherosclerotic heart disease of native coronary artery with other forms of angina pectoris: Secondary | ICD-10-CM | POA: Insufficient documentation

## 2023-09-26 DIAGNOSIS — E782 Mixed hyperlipidemia: Secondary | ICD-10-CM | POA: Diagnosis not present

## 2023-09-26 DIAGNOSIS — I10 Essential (primary) hypertension: Secondary | ICD-10-CM | POA: Insufficient documentation

## 2023-09-26 DIAGNOSIS — G4733 Obstructive sleep apnea (adult) (pediatric): Secondary | ICD-10-CM | POA: Diagnosis present

## 2023-09-26 DIAGNOSIS — E7800 Pure hypercholesterolemia, unspecified: Secondary | ICD-10-CM | POA: Diagnosis present

## 2023-09-26 DIAGNOSIS — E118 Type 2 diabetes mellitus with unspecified complications: Secondary | ICD-10-CM | POA: Insufficient documentation

## 2023-09-26 MED ORDER — PANTOPRAZOLE SODIUM 40 MG PO TBEC
40.0000 mg | DELAYED_RELEASE_TABLET | Freq: Every day | ORAL | 11 refills | Status: DC
Start: 2023-09-26 — End: 2023-09-26

## 2023-09-26 NOTE — Progress Notes (Signed)
 Cardiology Office Note:  .   Date:  09/27/2023  ID:  Jacqueline Hodges, DOB [birth year], MRN [id number] PCP: Jacqueline Heritage, NP  [facility] Providers Cardiologist:  Ahmad Alert, MD {  History of Present Illness: .   Jacqueline Hodges is a 58 y.o. female with history of nonobstructive CAD by heart catheterization 2022, hypertension, hyperlipidemia, diabetes, suspected OSA.     CAD NSTEMI 02/2021 left heart catheterization with moderate 70% stenosis in distal LAD.  25% stenosis mid RCA.  Medical management recommended.  Echo with preserved biventricular function with no significant valvular disease.   Syncope Seen 06/29/2023 for syncopal episode after taking her blood pressure medication, says she got hot and dizzy and then passed out.  Noted recent illness.  And newly prescribed losartan /HCTZ 1 week prior.  Improved after IV fluids.    Hypertensive urgency Noted in the ER 01/2019 with BP 180/80.  Possibly related to OSA.  STOP-BANG 4. No evidence of renal artery stenosis 04/2023 Aldosterone/renin activity 01/2023 normal.       Patient recently contacted our office saying that she felt the losartan /HCTZ was making her feel tired and without energy.  Blood pressure was reportedly hypotensive in the 80s, DOD had recommended checking BP regularly and bringing to help coming appointment.  Today patient presents for follow-up.  She reports that she started taking her Hyzaar separate from her other blood pressure medications and this has completely resolved her symptoms.  She no longer feels lightheaded or dizzy.  She brought her blood pressure machine but only has 4 readings that are between 110 and 160 systolics.      ROS: Denies: Chest pain, shortness of breath, orthopnea, peripheral edema, palpitations, decreased exercise intolerance, fatigue, lightheadedness.   Studies Reviewed: .         Risk Assessment/Calculations:     Physical Exam:   VS:  BP (!) 140/78   Pulse 73   Ht  5' 5 (1.651 m)   Wt 208 lb 12.8 oz (94.7 kg)   LMP 06/30/2017   SpO2 97%   BMI 34.75 kg/m    Wt Readings from Last 3 Encounters:  09/26/23 208 lb 12.8 oz (94.7 kg)  02/03/23 208 lb (94.3 kg)  03/14/22 206 lb 6.4 oz (93.6 kg)    GEN: Well nourished, well developed in no acute distress NECK: No JVD; No carotid bruits CARDIAC: RRR, no murmurs, rubs, gallops RESPIRATORY:  Clear to auscultation without rales, wheezing or rhonchi  ABDOMEN: Soft, non-tender, non-distended EXTREMITIES:  No edema; No deformity   ASSESSMENT AND PLAN: .    Nonobstructive CAD Noted on heart catheterization 2022 with 70% stenosis in distal LAD.  Medical management recommended.  Stable disease and without any chest pain or anginal equivalents. Continue aspirin  (was not taking), carvedilol , atorvastatin  80 mg, Zetia , Imdur  60 mg. Stop Plavix   Hypertension Unclear exactly what her blood pressure is as she recently has been reporting hypotension and only brought for measurements from her blood pressure machine that are between 110 and 160 systolics.  Blood pressure today is in the 140s.  She reports taking her Hyzaar separate from other medications in the morning as may a big difference and no longer has presyncopal events. For now continue current regimen of amlodipine  10 mg, carvedilol  25 mg twice daily, Hyzaar 100-25 mg, has as needed clonidine  0.1 mg as needed which she does not usually take.  Also on Imdur  60 mg daily. I have asked her to actually write down 2 measurements each  day morning and night and to send us  a MyChart message with at least 2 weeks of blood pressure measurements.  We will adjust accordingly once we get reliable measurements. No evidence of renal artery stenosis or hyperaldosteronism based off of previous workup.  OSA Stop bang of at least 4.  This is a significant risk factor to resistant hypertension Sleep study previously ordered but not completed.  Ordering split sleep study  now.  Hyperlipidemia LDL 1 year ago was 118.  Target goal less than 55 Repeating lipid panel when she is fasted. Sending lipid clinic referral for PCSK9 inhibitor if still not at target goal.  Diabetes No recent A1c.  She is going to see her PCP later this month and will get labs. Continue Jardiance  Dispo: 6 months.  She will see Dr. Audery Blazing in the future since Dr. Alroy Aspen is retiring.  Signed, Burnetta Cart, PA-C

## 2023-09-26 NOTE — Patient Instructions (Addendum)
 Medication Instructions:  START ASPIRIN    STOP PLAVIX   *If you need a refill on your cardiac medications before your next appointment, please call your pharmacy*  Lab Work: NO LABS If you have labs (blood work) drawn today and your tests are completely normal, you will receive your results only by: MyChart Message (if you have MyChart) OR A paper copy in the mail If you have any lab test that is abnormal or we need to change your treatment, we will call you to review the results.  Testing/Procedures: Your physician has recommended that you have a sleep study. This test records several body functions during sleep, including: brain activity, eye movement, oxygen and carbon dioxide blood levels, heart rate and rhythm, breathing rate and rhythm, the flow of air through your mouth and nose, snoring, body muscle movements, and chest and belly movement.   Follow-Up: At [facility], you and your health needs are our priority.  As part of our continuing mission to provide you with exceptional heart care, our providers are all part of one team.  This team includes your primary Cardiologist (physician) and Advanced Practice Providers or APPs (Physician Assistants and Nurse Practitioners) who all work together to provide you with the care you need, when you need it.  Your next appointment:   6 month(s)  Provider:   Maudine Sos, MD  Other Instructions You have been referred to Lipid Clinic

## 2023-10-05 ENCOUNTER — Ambulatory Visit: Admitting: Obstetrics & Gynecology

## 2023-11-14 ENCOUNTER — Encounter: Payer: Self-pay | Admitting: Pharmacist

## 2023-11-14 ENCOUNTER — Other Ambulatory Visit (HOSPITAL_COMMUNITY): Payer: Self-pay

## 2023-11-14 ENCOUNTER — Ambulatory Visit: Attending: Cardiovascular Disease | Admitting: Pharmacist

## 2023-11-14 ENCOUNTER — Telehealth: Payer: Self-pay | Admitting: Pharmacist

## 2023-11-14 ENCOUNTER — Telehealth: Payer: Self-pay

## 2023-11-14 DIAGNOSIS — E782 Mixed hyperlipidemia: Secondary | ICD-10-CM | POA: Insufficient documentation

## 2023-11-14 DIAGNOSIS — I25118 Atherosclerotic heart disease of native coronary artery with other forms of angina pectoris: Secondary | ICD-10-CM

## 2023-11-14 DIAGNOSIS — I10 Essential (primary) hypertension: Secondary | ICD-10-CM | POA: Insufficient documentation

## 2023-11-14 MED ORDER — CLONIDINE HCL 0.1 MG PO TABS
0.1000 mg | ORAL_TABLET | Freq: Two times a day (BID) | ORAL | 0 refills | Status: AC | PRN
  Filled 2023-11-14: qty 30, 15d supply, fill #0

## 2023-11-14 MED ORDER — CARVEDILOL 25 MG PO TABS
25.0000 mg | ORAL_TABLET | Freq: Two times a day (BID) | ORAL | 2 refills | Status: AC
  Filled 2023-11-14: qty 60, 30d supply, fill #0
  Filled 2024-01-24: qty 60, 30d supply, fill #1

## 2023-11-14 NOTE — Telephone Encounter (Signed)
 Pharmacy Patient Advocate Encounter   Received notification from Physician's Office that prior authorization for REPATHA is required/requested.   Insurance verification completed.   The patient is insured through [facility] .   Per test claim: PA required; PA submitted to above mentioned insurance via CoverMyMeds Key/confirmation #/EOC [id number] Status is pending

## 2023-11-14 NOTE — Patient Instructions (Addendum)
 It was nice meeting you today  We would like your LDL (bad cholesterol) to be less than 55  Please continue your atorvastatin  80mg  and ezetimibe  10mg  once daily  The medication we discussed today is called Repatha which is an injection you would take once every 2 weeks  I will complete the prior authorization for you and contact you with the result  Please contact us  with any questions  Chris Oliviagrace Crisanti, PharmD, BCACP, CDCES, CPP [facility] [address] [place], [place] [address] Phone: [phone]; Fax: [phone] 11/14/2023 3:25 PM

## 2023-11-14 NOTE — Telephone Encounter (Signed)
 PA request has been Submitted. New Encounter has been or will be created for follow up. For additional info see Pharmacy Prior Auth telephone encounter from 11/14/23.

## 2023-11-14 NOTE — Telephone Encounter (Signed)
 Please complete PA for Repatha and/or Praluent

## 2023-11-14 NOTE — Progress Notes (Signed)
 Patient ID: Jacqueline Hodges                 DOB: [birth year]                    MRN: [id number]     HPI: Jacqueline Hodges is a 58 y.o. female patient referred to lipid clinic by Thom Sluder PAC. Former patient of Dr Alveta. PMH is significant for CAD, ACS, HTN, T2DM, and HLD.  Patient presents today to discuss lipid management. Currently takes atorvastatin  80mg  and ezetimibe  10mg  once daily. Reports no adverse effects.  Works as a Engineer, structural for hospice patients.   Reports diet is erratic and therefore blood sugar has also been variable. Uses CGM and showed me recent readings. All >200.  Will frequently skip meals due to work and then eat at night when she gets home. Denies tobacco use. Mother was former smoker.  Had NSTEMI in 2022 with 70% stenosis in LAD.   Current Medications:  Atorvastatin  80mg  daily Zetia  10mg  daily  Intolerances: N/A  Risk Factors:  CAD Hx of NSTEMI T2DM  LDL goal: <55  Labs: TC 204, Trigs 127, HDL 30, LDL 151 (06/22/23)  Past Medical History:  Diagnosis Date   Abnormal perimenopausal bleeding 02/20/2017   CAD in native artery    a. NSTEMI 02/2021 with 70% dLAD, 25% mRCA but dLAD felt of uncertain significance, treated medically.   Cervical radiculopathy 01/12/2017   Diabetes mellitus (HCC)    Fibroid uterus 02/20/2017   Hyperlipidemia LDL goal <70    Hypertension    Migraine with aura and without status migrainosus, not intractable 01/12/2017   Precordial chest pain     Current Outpatient Medications on File Prior to Visit  Medication Sig Dispense Refill   amLODipine  (NORVASC ) 10 MG tablet Take 1 tablet (10 mg total) by mouth daily. 90 tablet 3   aspirin  EC 81 MG tablet Take 1 tablet (81 mg total) by mouth daily. Swallow whole. 90 tablet 3   atorvastatin  (LIPITOR ) 80 MG tablet Take 1 tablet (80 mg total) by mouth daily. 90 tablet 3   carvedilol  (COREG ) 25 MG tablet Take 1 tablet (25 mg total) by mouth 2 (two) times daily. 180 tablet 3   cloNIDine   (CATAPRES ) 0.1 MG tablet Take 1 tablet (0.1 mg total) by mouth 2 (two) times daily as needed. 30 tablet 0   Continuous Glucose Sensor (FREESTYLE LIBRE 3 PLUS SENSOR) MISC as directed.     cyclobenzaprine (FLEXERIL) 5 MG tablet Take 5-10 mg by mouth at bedtime as needed.     dapagliflozin propanediol (FARXIGA) 10 MG TABS tablet Take 10 mg by mouth daily.     diclofenac Sodium (VOLTAREN) 1 % GEL SMARTSIG:2 Gram(s) Topical 4 Times Daily PRN     DROPLET PEN NEEDLES 32G X 4 MM MISC daily.     EPINEPHrine  0.3 mg/0.3 mL IJ SOAJ injection Inject 0.3 mg into the muscle as needed for anaphylaxis. 1 each 0   ezetimibe  (ZETIA ) 10 MG tablet Take 1 tablet (10 mg total) by mouth daily. 90 tablet 3   insulin glargine (LANTUS) 100 UNIT/ML injection Inject 10 Units into the skin daily.     isosorbide  mononitrate (IMDUR ) 60 MG 24 hr tablet Take 1 tablet (60 mg total) by mouth daily. 90 tablet 3   losartan -hydrochlorothiazide  (HYZAAR) 100-25 MG tablet Take 1 tablet by mouth daily. 90 tablet 3   metFORMIN  (GLUCOPHAGE ) 500 MG tablet Take 1 tablet (500 mg total) by mouth 2 (  two) times daily with a meal. 60 tablet 2   naproxen  (NAPROSYN ) 375 MG tablet Take 1 tablet twice daily as needed for sharp chest pain. 20 tablet 0   nitroGLYCERIN  (NITROSTAT ) 0.4 MG SL tablet Dissolve 1 tablet under the tongue every 5 minutes as needed for chest pain. Max of 3 doses, then 911. 25 tablet 6   oxyCODONE  (ROXICODONE ) 5 MG immediate release tablet Take 1 tablet (5 mg total) by mouth every 6 (six) hours as needed for severe pain. 12 tablet 0   pantoprazole  (PROTONIX ) 40 MG tablet Take 40 mg by mouth daily.     predniSONE  (DELTASONE ) 20 MG tablet 3 tabs po daily x 3 days, then 2 tabs x 3 days, then 1.5 tabs x 3 days, then 1 tab x 3 days, then 0.5 tabs x 3 days 27 tablet 0   rOPINIRole (REQUIP) 0.5 MG tablet Take 0.25 mg by mouth.     No current facility-administered medications on file prior to visit.    Allergies  Allergen Reactions    Lisinopril      Other Reaction(s): swelling/itching    Assessment/Plan:  1. Hyperlipidemia - Patient last LDL 151 which is above goal of <55 despite high intensity statin plus ezetimibe . Aggressive goal due to NSTEMI and T2DM.   Recommend addition of PCSK9i. Using demo pen, educated on mechanism of action, storage, site selection, administration and possible adverse effects. Will complete PA and contact patient with response. Recheck lipid panel in 2-3 months.  Continue atorvastatin  80mg  daily Continue Zetia  10mg  daily Start Repatha/Praluent q 2 weeks Recheck lipid panel in 2-3 months  Jacqueline Hodges, PharmD, [place], CDCES, CPP [facility] [address], [place], [place] [address] Phone: [phone]; Fax: [phone] 11/14/2023 4:16 PM

## 2023-11-15 NOTE — Telephone Encounter (Signed)
Scanned in media as well.

## 2023-11-16 ENCOUNTER — Other Ambulatory Visit (HOSPITAL_COMMUNITY)
Admission: RE | Admit: 2023-11-16 | Discharge: 2023-11-16 | Disposition: A | Discharge status: Home / Self Care | Source: Ambulatory Visit | Attending: Obstetrics and Gynecology | Admitting: Obstetrics and Gynecology

## 2023-11-16 ENCOUNTER — Encounter: Payer: Self-pay | Admitting: Obstetrics and Gynecology

## 2023-11-16 ENCOUNTER — Other Ambulatory Visit: Payer: Self-pay

## 2023-11-16 ENCOUNTER — Ambulatory Visit: Admitting: Obstetrics and Gynecology

## 2023-11-16 ENCOUNTER — Other Ambulatory Visit (HOSPITAL_COMMUNITY): Payer: Self-pay

## 2023-11-16 VITALS — BP 158/100 | Ht 65.0 in | Wt 212.2 lb

## 2023-11-16 DIAGNOSIS — Z1239 Encounter for other screening for malignant neoplasm of breast: Secondary | ICD-10-CM

## 2023-11-16 DIAGNOSIS — Z124 Encounter for screening for malignant neoplasm of cervix: Secondary | ICD-10-CM

## 2023-11-16 DIAGNOSIS — R232 Flushing: Secondary | ICD-10-CM

## 2023-11-16 DIAGNOSIS — Z01419 Encounter for gynecological examination (general) (routine) without abnormal findings: Secondary | ICD-10-CM | POA: Diagnosis present

## 2023-11-16 DIAGNOSIS — Z113 Encounter for screening for infections with a predominantly sexual mode of transmission: Secondary | ICD-10-CM

## 2023-11-16 DIAGNOSIS — N941 Unspecified dyspareunia: Secondary | ICD-10-CM | POA: Diagnosis not present

## 2023-11-16 MED ORDER — GABAPENTIN 300 MG PO CAPS: 300.0000 mg | ORAL_CAPSULE | Freq: Every day | ORAL | 1 refills | Status: AC

## 2023-11-16 MED ORDER — ESTRADIOL 0.1 MG/GM VA CREA: TOPICAL_CREAM | VAGINAL | 2 refills | Status: DC

## 2023-11-16 NOTE — Telephone Encounter (Signed)
 Pharmacy Patient Advocate Encounter  Received notification from [facility] that Prior Authorization for REPATHA has been APPROVED from 11/14/23 to 11/13/24. Ran test claim, Copay is $4. This test claim was processed through [facility]- copay amounts may vary at other pharmacies due to pharmacy/plan contracts, or as the patient moves through the different stages of their insurance plan.

## 2023-11-16 NOTE — Progress Notes (Cosign Needed Addendum)
 Subjective:     Jacqueline Hodges is a 58 y.o. female here at [facility] for a routine exam.  Current complaints: pain with initiation of intercourse and hot flashes. States that she has used [place] Jelly with some relief. Has not used any other otc methods for relief. Also reports decreased libido. Personal and family health history reviewed: yes.  Do you have a primary care provider? Yes Do you feel safe at home? Yes  Flowsheet Row Patient Outreach Telephone from 06/07/2021 in [facility] Coordination  PHQ-2 Total Score 0    Health Maintenance Due  Topic Date Due   COVID-19 Vaccine (1) Never done   FOOT EXAM  Never done   OPHTHALMOLOGY EXAM  Never done   Diabetic kidney evaluation - Urine ACR  Never done   Hepatitis C Screening  Never done   DTaP/Tdap/Td (1 - Tdap) Never done   Pneumococcal Vaccine: 19-49 Years (1 of 2 - PCV) Never done   Pneumococcal Vaccine: 50+ Years (1 of 2 - PCV) Never done   Hepatitis B Vaccines (1 of 3 - 19+ 3-dose series) Never done   Zoster Vaccines- Shingrix (1 of 2) Never done   Cervical Cancer Screening (HPV/Pap Cotest)  Never done   Colonoscopy  Never done   MAMMOGRAM  Never done   HEMOGLOBIN A1C  08/02/2021   INFLUENZA VACCINE  11/10/2023     Risk factors for chronic health problems: Smoking: Alchohol/how much: Pt BMI: Body mass index is 35.31 kg/m.   Gynecologic History Patient's last menstrual period was 06/30/2017. Contraception: post menopausal status Last Pap: 2019. Results were: normal Last mammogram: 2019. Results were: normal  Obstetric History OB History  Gravida Para Term Preterm AB Living  3 3 3  0 0 3  SAB IAB Ectopic Multiple Live Births  0 0 0 0 3    # Outcome Date GA Lbr Len/2nd Weight Sex Type Anes PTL Lv  3 Term      CS-LTranv     2 Term      CS-LTranv     1 Term      CS-LTranv        The following portions of the patient's history were reviewed and updated as appropriate: allergies, current  medications, past family history, past medical history, past social history, past surgical history, and problem list.  Review of Systems Pertinent items noted in HPI and remainder of comprehensive ROS otherwise negative.    Objective:   Today's Vitals   11/16/23 1353  Weight: 212 lb 3.2 oz (96.3 kg)  Height: 5' 5 (1.651 m)  PainSc: 0-No pain   Body mass index is 35.31 kg/m.  VS reviewed, nursing note reviewed,  Constitutional: well developed, well nourished, no distress HEENT: normocephalic, thyroid without enlargement or mass HEART: RRR, no murmurs rubs/gallops RESP: clear and equal to auscultation bilaterally in all lobes  Breast Exam:  exam performed: right breast normal without mass, skin or nipple changes or axillary nodes, left breast normal without mass, skin or nipple changes or axillary nodes Abdomen: soft Neuro: alert and oriented x 3 Skin: warm, dry Psych: affect normal Pelvic exam:Performed: Cervix pink, visually closed, without lesion, scant white creamy discharge, vaginal walls and external genitalia normal Bimanual exam: Cervix 0/long/high, firm, anterior, neg CMT, uterus nontender, nonenlarged, adnexa without tenderness, enlargement, or mass        Assessment/Plan:  1. Well woman exam (Primary) - Cytology - PAP( [place]) - MM 3D SCREENING MAMMOGRAM  BILATERAL BREAST; Future - HIV Antibody (routine testing w rflx) - RPR - Hepatitis B Surface AntiGEN - Hepatitis C Antibody  2. Cervical cancer screening - Cytology - PAP( [place])  3. Breast screening - MM 3D SCREENING MAMMOGRAM BILATERAL BREAST; Future  4. Screening for STD (sexually transmitted disease) - Cytology - PAP( [place]) - HIV Antibody (routine testing w rflx) - RPR - Hepatitis B Surface AntiGEN - Hepatitis C Antibody  5. Dyspareunia in female Follow up in 1 month - estradiol  (ESTRACE  VAGINAL) 0.1 MG/GM vaginal cream; Use 0.5g once daily for 2 weeks. Then use 0.5 two time per  week  Dispense: 42.5 g; Refill: 2  6. Hot flashes Follow up in 1 month - gabapentin  (NEURONTIN ) 300 MG capsule; Take 1 capsule (300 mg total) by mouth at bedtime.  Dispense: 60 capsule; Refill: 1       Return 1-2 month follow up with Ajewole.   Derrek JINNY Freund, NP Student 2:46 PM

## 2023-11-17 LAB — HEPATITIS B SURFACE ANTIGEN: Hepatitis B Surface Ag: NEGATIVE

## 2023-11-17 LAB — RPR: RPR Ser Ql: NONREACTIVE

## 2023-11-17 LAB — HEPATITIS C ANTIBODY: Hep C Virus Ab: NONREACTIVE

## 2023-11-17 LAB — HIV ANTIBODY (ROUTINE TESTING W REFLEX): HIV Screen 4th Generation wRfx: NONREACTIVE

## 2023-11-19 ENCOUNTER — Ambulatory Visit: Payer: Self-pay | Admitting: Obstetrics and Gynecology

## 2023-11-21 MED ORDER — REPATHA SURECLICK 140 MG/ML ~~LOC~~ SOAJ: 1.0000 mL | SUBCUTANEOUS | 1 refills | Status: AC

## 2023-11-21 NOTE — Addendum Note (Signed)
 Addended by: DARRELL BRUCKNER on: 11/21/2023 11:33 AM   Modules accepted: Orders

## 2023-11-22 LAB — CYTOLOGY - PAP
Adequacy: ABSENT
Chlamydia: NEGATIVE
Comment: NEGATIVE
Comment: NEGATIVE
Comment: NEGATIVE
Comment: NEGATIVE
Comment: NEGATIVE
Comment: NORMAL
Diagnosis: NEGATIVE
HPV 16: NEGATIVE
HPV 18 / 45: NEGATIVE
High risk HPV: POSITIVE — AB
Neisseria Gonorrhea: NEGATIVE
Trichomonas: NEGATIVE

## 2023-12-06 ENCOUNTER — Ambulatory Visit

## 2023-12-21 ENCOUNTER — Ambulatory Visit

## 2024-01-02 ENCOUNTER — Ambulatory Visit: Admitting: Obstetrics and Gynecology

## 2024-01-02 ENCOUNTER — Encounter: Payer: Self-pay | Admitting: Obstetrics and Gynecology

## 2024-01-02 ENCOUNTER — Other Ambulatory Visit: Payer: Self-pay

## 2024-01-02 VITALS — BP 147/94 | HR 75 | Wt 205.0 lb

## 2024-01-02 DIAGNOSIS — R6882 Decreased libido: Secondary | ICD-10-CM

## 2024-01-02 DIAGNOSIS — Z1331 Encounter for screening for depression: Secondary | ICD-10-CM | POA: Diagnosis not present

## 2024-01-02 DIAGNOSIS — N941 Unspecified dyspareunia: Secondary | ICD-10-CM

## 2024-01-02 MED ORDER — ESTROGENS CONJUGATED 0.625 MG/GM VA CREA: 1.0000 | TOPICAL_CREAM | Freq: Every day | VAGINAL | 12 refills | Status: AC

## 2024-01-02 MED ORDER — BUPROPION HCL ER (XL) 150 MG PO TB24: 150.0000 mg | ORAL_TABLET | Freq: Every day | ORAL | 3 refills | Status: AC

## 2024-01-02 NOTE — Progress Notes (Unsigned)
 GYNECOLOGY VISIT  Patient name: Jacqueline Hodges MRN [id number]  Date of birth: [birth year] Chief Complaint:   Gynecologic Exam Last seen for painful sex and hot flashes. At the beginngin willhave pain. Not able to get cream due to insurance. Does not have deisre for sex for 2 years and wondering if there is anything that is safe given hx of heart attacks.  Gets very hot then freezing afterwrrds since heart attack lasting 5-10 minutes. Does not occur frequently, randomly occurs, not taking gabapentin  and told it was not good for her.   History:  Discussed the use of AI scribe software for clinical note transcription with the patient, who gave verbal consent to proceed.  History of Present Illness Jacqueline Hodges is a 58 year old female who presents with concerns about medication side effects and menopausal symptoms.  She has been prescribed gabapentin  and vaginal estrogen by a previous provider but has not started these medications due to concerns about side effects, particularly in light of her father's history of a blood clot and subsequent pulmonary embolism. She is cautious about medications due to concerns about side effects.  She experiences daily knee pain, which is severe and persistent despite undergoing three surgeries on one knee. The pain is significant and impacts her daily life.  She describes episodes of feeling hot and suffocated, especially in warm or enclosed spaces, which began after her heart attack in 2023. These episodes are distressing, and she prefers cooler environments, using air conditioning and a fan at night to stay comfortable. She does not experience excessive sweating during these episodes.  She has not had a menstrual period since 2020 or 2021 and has been experiencing changes in her body temperature regulation since her heart attack. She does not believe these are traditional hot flashes, as she does not sweat profusely like her mother did during menopause.  She  reports vaginal dryness and discomfort, which she attributes to menopause. She has been unable to obtain vaginal estrogen due to insurance issues and availability at the pharmacy. She is open to using a cream form of vaginal estrogen to alleviate these symptoms.  She mentions a lack of sexual desire, which she attributes to past painful experiences and a general lack of interest. Her libido has significantly decreased since her heart attack and the cessation of her menstrual periods. She has been with her partner for seven years and feels pressure to maintain the relationship despite her lack of desire.     The following portions of the patient's history were reviewed and updated as appropriate: allergies, current medications, past family history, past medical history, past social history, past surgical history and problem list.   Health Maintenance:   Last pap     Component Value Date/Time   DIAGPAP  11/16/2023 1447    - Negative for intraepithelial lesion or malignancy (NILM)   HPVHIGH Positive (A) 11/16/2023 1447   ADEQPAP  11/16/2023 1447    Satisfactory for evaluation; transformation zone component ABSENT.    Health Maintenance  Topic Date Due   COVID-19 Vaccine (1) Never done   Complete foot exam   Never done   Eye exam for diabetics  Never done   Yearly kidney health urinalysis for diabetes  Never done   DTaP/Tdap/Td vaccine (1 - Tdap) Never done   Pneumococcal Vaccine for age over 76 (1 of 2 - PCV) Never done   Hepatitis B Vaccine (1 of 3 - 19+ 3-dose series) Never done   Zoster (  Shingles) Vaccine (1 of 2) Never done   Breast Cancer Screening  Never done   Colon Cancer Screening  Never done   Hemoglobin A1C  08/02/2021   Flu Shot  11/10/2023   Yearly kidney function blood test for diabetes  06/25/2024   Pap with HPV screening  11/15/2024   Hepatitis C Screening  Completed   HIV Screening  Completed   HPV Vaccine  Aged Out   Meningitis B Vaccine  Aged Out      Review  of Systems:  {Ros - complete:30496} Comprehensive review of systems was otherwise negative.   Objective:  Physical Exam BP (!) 145/105   Pulse 76   Wt 205 lb (93 kg)   LMP 06/30/2017   BMI 34.11 kg/m    Physical Exam   Labs and Imaging No results found.     Assessment & Plan:  Assessment and Plan Assessment & Plan Vaginal dryness due to menopause Vaginal dryness likely due to menopause. Unable to obtain vaginal estrogen due to insurance issues. Vaginal estrogen considered safe with cardiac history. - Prescribe Premarin  cream for vaginal use. - Advise use of regular lubricant or coconut oil for moisture.  Decreased libido Decreased libido possibly related to previous bleeding and pain. Hormonal treatments contraindicated due to cardiac history. Antidepressants considered but require evaluation for interactions. - Consider trial of antidepressants for decreased libido. - Evaluate potential interactions with current medications before starting antidepressants.       *** Routine preventative health maintenance measures emphasized.  Carter Quarry, MD Minimally Invasive Gynecologic Surgery [facility], [facility]

## 2024-01-11 ENCOUNTER — Ambulatory Visit

## 2024-01-12 ENCOUNTER — Ambulatory Visit
Admission: RE | Admit: 2024-01-12 | Discharge: 2024-01-12 | Disposition: A | Discharge status: Home / Self Care | Source: Ambulatory Visit | Attending: Obstetrics and Gynecology | Admitting: Obstetrics and Gynecology

## 2024-01-12 DIAGNOSIS — Z1239 Encounter for other screening for malignant neoplasm of breast: Secondary | ICD-10-CM

## 2024-01-12 DIAGNOSIS — Z01419 Encounter for gynecological examination (general) (routine) without abnormal findings: Secondary | ICD-10-CM

## 2024-02-02 ENCOUNTER — Other Ambulatory Visit (HOSPITAL_COMMUNITY): Payer: Self-pay
